# Patient Record
Sex: Male | Born: 1950 | Race: Black or African American | Hispanic: No | Marital: Married | Smoking: Never smoker
Health system: Southern US, Community
[De-identification: ages and names within clinical notes are randomized; demographics above are authoritative.]

## PROBLEM LIST (undated history)

## (undated) DIAGNOSIS — G4730 Sleep apnea, unspecified: Secondary | ICD-10-CM

## (undated) DIAGNOSIS — K219 Gastro-esophageal reflux disease without esophagitis: Secondary | ICD-10-CM

## (undated) DIAGNOSIS — E119 Type 2 diabetes mellitus without complications: Secondary | ICD-10-CM

## (undated) DIAGNOSIS — C61 Malignant neoplasm of prostate: Secondary | ICD-10-CM

## (undated) DIAGNOSIS — D649 Anemia, unspecified: Secondary | ICD-10-CM

## (undated) DIAGNOSIS — I10 Essential (primary) hypertension: Secondary | ICD-10-CM

## (undated) HISTORY — PX: LEG SURGERY: SHX1003

## (undated) HISTORY — PX: FOOT FRACTURE SURGERY: SHX645

## (undated) HISTORY — PX: PROSTATE BIOPSY: SHX241

---

## 1999-02-11 ENCOUNTER — Emergency Department (HOSPITAL_COMMUNITY): Admission: EM | Admit: 1999-02-11 | Discharge: 1999-02-11 | Payer: Self-pay | Admitting: Emergency Medicine

## 1999-02-11 ENCOUNTER — Encounter: Payer: Self-pay | Admitting: Emergency Medicine

## 1999-03-06 ENCOUNTER — Ambulatory Visit (HOSPITAL_BASED_OUTPATIENT_CLINIC_OR_DEPARTMENT_OTHER): Admission: RE | Admit: 1999-03-06 | Discharge: 1999-03-06 | Payer: Self-pay | Admitting: *Deleted

## 1999-05-18 ENCOUNTER — Ambulatory Visit (HOSPITAL_COMMUNITY): Admission: RE | Admit: 1999-05-18 | Discharge: 1999-05-18 | Payer: Self-pay | Admitting: *Deleted

## 2010-08-09 ENCOUNTER — Encounter: Payer: Self-pay | Admitting: Internal Medicine

## 2014-10-02 ENCOUNTER — Other Ambulatory Visit: Payer: Self-pay | Admitting: Gastroenterology

## 2014-10-03 ENCOUNTER — Encounter (HOSPITAL_COMMUNITY): Payer: Self-pay | Admitting: *Deleted

## 2014-10-07 ENCOUNTER — Ambulatory Visit (HOSPITAL_COMMUNITY): Payer: Medicare PPO | Admitting: Anesthesiology

## 2014-10-07 ENCOUNTER — Encounter (HOSPITAL_COMMUNITY): Payer: Self-pay | Admitting: Gastroenterology

## 2014-10-07 ENCOUNTER — Ambulatory Visit (HOSPITAL_COMMUNITY)
Admission: RE | Admit: 2014-10-07 | Discharge: 2014-10-07 | Disposition: A | Discharge status: Home / Self Care | Payer: Medicare PPO | Source: Ambulatory Visit | Attending: Gastroenterology | Admitting: Gastroenterology

## 2014-10-07 ENCOUNTER — Encounter (HOSPITAL_COMMUNITY)
Admission: RE | Disposition: A | Discharge status: Home / Self Care | Payer: Self-pay | Source: Ambulatory Visit | Attending: Gastroenterology

## 2014-10-07 DIAGNOSIS — K219 Gastro-esophageal reflux disease without esophagitis: Secondary | ICD-10-CM | POA: Diagnosis not present

## 2014-10-07 DIAGNOSIS — E119 Type 2 diabetes mellitus without complications: Secondary | ICD-10-CM | POA: Diagnosis not present

## 2014-10-07 DIAGNOSIS — I10 Essential (primary) hypertension: Secondary | ICD-10-CM | POA: Diagnosis not present

## 2014-10-07 DIAGNOSIS — Z79899 Other long term (current) drug therapy: Secondary | ICD-10-CM | POA: Diagnosis not present

## 2014-10-07 DIAGNOSIS — G4733 Obstructive sleep apnea (adult) (pediatric): Secondary | ICD-10-CM | POA: Insufficient documentation

## 2014-10-07 DIAGNOSIS — K648 Other hemorrhoids: Secondary | ICD-10-CM | POA: Insufficient documentation

## 2014-10-07 DIAGNOSIS — K5730 Diverticulosis of large intestine without perforation or abscess without bleeding: Secondary | ICD-10-CM | POA: Diagnosis not present

## 2014-10-07 DIAGNOSIS — E780 Pure hypercholesterolemia: Secondary | ICD-10-CM | POA: Insufficient documentation

## 2014-10-07 DIAGNOSIS — K921 Melena: Secondary | ICD-10-CM | POA: Diagnosis present

## 2014-10-07 HISTORY — DX: Type 2 diabetes mellitus without complications: E11.9

## 2014-10-07 HISTORY — PX: COLONOSCOPY WITH PROPOFOL: SHX5780

## 2014-10-07 HISTORY — DX: Essential (primary) hypertension: I10

## 2014-10-07 HISTORY — DX: Gastro-esophageal reflux disease without esophagitis: K21.9

## 2014-10-07 HISTORY — DX: Sleep apnea, unspecified: G47.30

## 2014-10-07 LAB — GLUCOSE, CAPILLARY: Glucose-Capillary: 136 mg/dL — ABNORMAL HIGH (ref 70–99)

## 2014-10-07 SURGERY — COLONOSCOPY WITH PROPOFOL
Anesthesia: Monitor Anesthesia Care

## 2014-10-07 MED ORDER — LACTATED RINGERS IV SOLN
INTRAVENOUS | Status: DC
  Administered 2014-10-07: 1000 mL via INTRAVENOUS

## 2014-10-07 MED ORDER — SODIUM CHLORIDE 0.9 % IV SOLN: INTRAVENOUS | Status: DC

## 2014-10-07 MED ORDER — PROPOFOL 10 MG/ML IV BOLUS
INTRAVENOUS | Status: AC
  Filled 2014-10-07: qty 20

## 2014-10-07 MED ORDER — PROPOFOL INFUSION 10 MG/ML OPTIME
INTRAVENOUS | Status: DC | PRN
  Administered 2014-10-07: 160 ug/kg/min via INTRAVENOUS

## 2014-10-07 SURGICAL SUPPLY — 22 items

## 2014-10-07 NOTE — H&P (Signed)
  Problem: Painless hematochezia on aspirin. Normal CBC. 08/26/2011 normal surveillance colonoscopy. 04/15/2005 colonoscopy with removal of a diminutive adenomatous cecal polyp. Colonic diverticulosis.  History: The patient is a 64 year old male born 1950-12-22. For approximately 2 weeks, the patient experienced intermittent small-volume painless hematochezia despite using Anusol cream on his external hemorrhoids. In 2006, he underwent a colonoscopy with removal of a diminutive cecal adenomatous polyp. In 2013, his surveillance colonoscopy was normal except for the presence of colonic diverticulosis.  The patient is scheduled to undergo diagnostic colonoscopy today.  Past medical history: Type 2 diabetes mellitus. Hypercholesterolemia. Obstructive sleep apnea syndrome. Hypertension. Gastroesophageal reflux. Left hip surgery. Sinus surgery.  Medication allergies: Sudafed caused palpitations. Hydrocodone caused itching. Metformin causes diarrhea.  Exam: The patient is alert and lying comfortably on the endoscopy stretcher. Abdomen is soft and nontender to palpation. Lungs are clear to auscultation. Cardiac exam reveals a regular rhythm.  Plan: Proceed with diagnostic colonoscopy to evaluate painless hematochezia.

## 2014-10-07 NOTE — Anesthesia Postprocedure Evaluation (Signed)
Anesthesia Post Note  Patient: Victor Garcia  Procedure(s) Performed: Procedure(s) (LRB): COLONOSCOPY WITH PROPOFOL (N/A)  Anesthesia type: general  Patient location: PACU  Post pain: Pain level controlled  Post assessment: Patient's Cardiovascular Status Stable  Last Vitals:  Filed Vitals:   10/07/14 0820  BP: 147/83  Pulse: 58  Temp:   Resp: 15    Post vital signs: Reviewed and stable  Level of consciousness: sedated  Complications: No apparent anesthesia complications

## 2014-10-07 NOTE — Transfer of Care (Signed)
Immediate Anesthesia Transfer of Care Note  Patient: Victor Garcia  Procedure(s) Performed: Procedure(s): COLONOSCOPY WITH PROPOFOL (N/A)  Patient Location: PACU  Anesthesia Type:MAC  Level of Consciousness: sedated  Airway & Oxygen Therapy: Patient Spontanous Breathing and Patient connected to nasal cannula oxygen  Post-op Assessment: Report given to RN and Post -op Vital signs reviewed and stable  Post vital signs: Reviewed and stable  Last Vitals:  Filed Vitals:   10/07/14 0654  BP: 156/83  Pulse: 65  Temp: 36.6 C  Resp: 22    Complications: No apparent anesthesia complications

## 2014-10-07 NOTE — Anesthesia Preprocedure Evaluation (Signed)
Anesthesia Evaluation  Patient identified by MRN, date of birth, ID band Patient awake    Reviewed: Allergy & Precautions, NPO status , Patient's Chart, lab work & pertinent test results  Airway Mallampati: II  TM Distance: >3 FB Neck ROM: Full    Dental   Pulmonary sleep apnea ,          Cardiovascular hypertension, Pt. on medications     Neuro/Psych    GI/Hepatic GERD-  Medicated and Controlled,  Endo/Other  diabetes, Type 2, Oral Hypoglycemic Agents  Renal/GU      Musculoskeletal   Abdominal   Peds  Hematology   Anesthesia Other Findings   Reproductive/Obstetrics                             Anesthesia Physical Anesthesia Plan  ASA: III  Anesthesia Plan: MAC   Post-op Pain Management:    Induction: Intravenous  Airway Management Planned: Natural Airway  Additional Equipment:   Intra-op Plan:   Post-operative Plan:   Informed Consent: I have reviewed the patients History and Physical, chart, labs and discussed the procedure including the risks, benefits and alternatives for the proposed anesthesia with the patient or authorized representative who has indicated his/her understanding and acceptance.     Plan Discussed with: CRNA and Surgeon  Anesthesia Plan Comments:         Anesthesia Quick Evaluation

## 2014-10-07 NOTE — Op Note (Signed)
Procedure: Diagnostic colonoscopy to evaluate painless hematochezia on aspirin. Normal CBC. 08/26/2011 normal surveillance colonoscopy. 04/15/2005 colonoscopy with removal of a diminutive adenomatous cecal polyp. Universal colonic diverticulosis.  Endoscopist: Earle Gell  Premedication: Propofol administered by anesthesia  Procedure: The patient was placed in the left lateral decubitus position. Anal inspection and digital rectal exam were normal. The Pentax pediatric colonoscope was introduced into the rectum and advanced to the cecum. A normal-appearing appendiceal orifice was identified. A normal-appearing ileocecal valve was identified. Colonic preparation for the exam today was satisfactory. Withdrawal time was 14 minutes.  Universal colonic diverticulosis was present without signs of gastrointestinal bleeding  Rectum. Normal. Retroflex view of the distal rectum showed moderate sized non-bleeding internal hemorrhoids  Sigmoid colon and descending colon. Normal  Splenic flexure. Normal  Transverse colon. Normal  Hepatic flexure. Normal  Ascending colon. Normal  Cecum and ileocecal valve. Normal  Assessment:  #1. Universal colonic diverticulosis without signs of gastrointestinal bleeding  #2. Moderate sized nonbleeding internal hemorrhoids  #3. No signs of colorectal neoplasia  Recommendation: Schedule repeat surveillance colonoscopy in 10 years

## 2014-10-08 ENCOUNTER — Encounter (HOSPITAL_COMMUNITY): Payer: Self-pay | Admitting: Gastroenterology

## 2014-12-04 DIAGNOSIS — H4011X1 Primary open-angle glaucoma, mild stage: Secondary | ICD-10-CM | POA: Diagnosis not present

## 2014-12-04 DIAGNOSIS — E119 Type 2 diabetes mellitus without complications: Secondary | ICD-10-CM | POA: Diagnosis not present

## 2014-12-19 DIAGNOSIS — H4011X1 Primary open-angle glaucoma, mild stage: Secondary | ICD-10-CM | POA: Diagnosis not present

## 2015-01-03 DIAGNOSIS — H4011X1 Primary open-angle glaucoma, mild stage: Secondary | ICD-10-CM | POA: Diagnosis not present

## 2015-01-03 DIAGNOSIS — H04123 Dry eye syndrome of bilateral lacrimal glands: Secondary | ICD-10-CM | POA: Diagnosis not present

## 2015-02-05 DIAGNOSIS — H4011X1 Primary open-angle glaucoma, mild stage: Secondary | ICD-10-CM | POA: Diagnosis not present

## 2015-02-19 ENCOUNTER — Ambulatory Visit
Admission: RE | Admit: 2015-02-19 | Discharge: 2015-02-19 | Disposition: A | Discharge status: Home / Self Care | Payer: Medicare PPO | Source: Ambulatory Visit | Attending: Internal Medicine | Admitting: Internal Medicine

## 2015-02-19 ENCOUNTER — Other Ambulatory Visit: Payer: Self-pay | Admitting: Internal Medicine

## 2015-02-19 DIAGNOSIS — M79671 Pain in right foot: Secondary | ICD-10-CM

## 2015-02-19 DIAGNOSIS — S99921A Unspecified injury of right foot, initial encounter: Secondary | ICD-10-CM | POA: Diagnosis not present

## 2015-02-24 DIAGNOSIS — M79671 Pain in right foot: Secondary | ICD-10-CM | POA: Diagnosis not present

## 2015-04-08 DIAGNOSIS — Z23 Encounter for immunization: Secondary | ICD-10-CM | POA: Diagnosis not present

## 2015-04-08 DIAGNOSIS — N5201 Erectile dysfunction due to arterial insufficiency: Secondary | ICD-10-CM | POA: Diagnosis not present

## 2015-04-08 DIAGNOSIS — I10 Essential (primary) hypertension: Secondary | ICD-10-CM | POA: Diagnosis not present

## 2015-04-08 DIAGNOSIS — G4733 Obstructive sleep apnea (adult) (pediatric): Secondary | ICD-10-CM | POA: Diagnosis not present

## 2015-04-08 DIAGNOSIS — E119 Type 2 diabetes mellitus without complications: Secondary | ICD-10-CM | POA: Diagnosis not present

## 2015-06-19 DIAGNOSIS — H04123 Dry eye syndrome of bilateral lacrimal glands: Secondary | ICD-10-CM | POA: Diagnosis not present

## 2015-06-19 DIAGNOSIS — H401131 Primary open-angle glaucoma, bilateral, mild stage: Secondary | ICD-10-CM | POA: Diagnosis not present

## 2017-03-05 ENCOUNTER — Encounter (HOSPITAL_COMMUNITY): Payer: Self-pay | Admitting: Emergency Medicine

## 2017-03-05 ENCOUNTER — Ambulatory Visit (HOSPITAL_COMMUNITY)
Admission: EM | Admit: 2017-03-05 | Discharge: 2017-03-05 | Disposition: A | Discharge status: Home / Self Care | Payer: Medicare Other | Attending: Emergency Medicine | Admitting: Emergency Medicine

## 2017-03-05 ENCOUNTER — Ambulatory Visit (INDEPENDENT_AMBULATORY_CARE_PROVIDER_SITE_OTHER): Payer: Self-pay

## 2017-03-05 DIAGNOSIS — S8002XA Contusion of left knee, initial encounter: Secondary | ICD-10-CM | POA: Diagnosis not present

## 2017-03-05 DIAGNOSIS — S39012A Strain of muscle, fascia and tendon of lower back, initial encounter: Secondary | ICD-10-CM
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
## 2019-08-27 ENCOUNTER — Ambulatory Visit: Payer: Self-pay | Attending: Internal Medicine

## 2019-08-27 DIAGNOSIS — Z23 Encounter for immunization: Secondary | ICD-10-CM | POA: Insufficient documentation

## 2019-08-27 NOTE — Progress Notes (Signed)
   Covid-19 Vaccination Clinic  Name:  Victor Garcia    MRN: [id number] DOB: 23-Jan-1951  08/27/2019  [name] was observed post Covid-19 immunization for 15 minutes without incidence. He was provided with Vaccine Information Sheet and instruction to access the V-Safe system.   [name] was instructed to call 911 with any severe reactions post vaccine: Marland Kitchen Difficulty breathing  . Swelling of your face and throat  . A fast heartbeat  . A bad rash all over your body  . Dizziness and weakness    Immunizations Administered    Name Date Dose VIS Date Route   Pfizer COVID-19 Vaccine 08/27/2019  2:38 PM 0.3 mL 06/29/2019 Intramuscular   Manufacturer: [place]   Lot: [id number]   [place]: [id number]

## 2019-09-21 ENCOUNTER — Ambulatory Visit: Payer: Medicare PPO | Attending: Internal Medicine

## 2019-09-21 DIAGNOSIS — Z23 Encounter for immunization: Secondary | ICD-10-CM

## 2019-09-21 NOTE — Progress Notes (Signed)
   Covid-19 Vaccination Clinic  Name:  Tyrion Tornatore    MRN: [id number] DOB: 1951/02/21  09/21/2019  Mr. Kofoed was observed post Covid-19 immunization for 15 minutes without incident. He was provided with Vaccine Information Sheet and instruction to access the V-Safe system.   Mr. Severson was instructed to call 911 with any severe reactions post vaccine: Marland Kitchen Difficulty breathing  . Swelling of face and throat  . A fast heartbeat  . A bad rash all over body  . Dizziness and weakness   Immunizations Administered    Name Date Dose VIS Date Route   Pfizer COVID-19 Vaccine 09/21/2019  9:53 AM 0.3 mL 06/29/2019 Intramuscular   Manufacturer: [place]   Lot: [id number]   [place]: [id number]

## 2019-10-26 DIAGNOSIS — H2000 Unspecified acute and subacute iridocyclitis: Secondary | ICD-10-CM | POA: Diagnosis not present

## 2019-10-26 DIAGNOSIS — H401131 Primary open-angle glaucoma, bilateral, mild stage: Secondary | ICD-10-CM | POA: Diagnosis not present

## 2019-11-09 DIAGNOSIS — H2000 Unspecified acute and subacute iridocyclitis: Secondary | ICD-10-CM | POA: Diagnosis not present

## 2019-11-09 DIAGNOSIS — H401131 Primary open-angle glaucoma, bilateral, mild stage: Secondary | ICD-10-CM | POA: Diagnosis not present

## 2019-11-13 DIAGNOSIS — H401131 Primary open-angle glaucoma, bilateral, mild stage: Secondary | ICD-10-CM | POA: Diagnosis not present

## 2019-11-13 DIAGNOSIS — E119 Type 2 diabetes mellitus without complications: Secondary | ICD-10-CM | POA: Diagnosis not present

## 2019-11-13 DIAGNOSIS — H524 Presbyopia: Secondary | ICD-10-CM | POA: Diagnosis not present

## 2019-11-13 DIAGNOSIS — H2513 Age-related nuclear cataract, bilateral: Secondary | ICD-10-CM | POA: Diagnosis not present

## 2019-12-12 DIAGNOSIS — R972 Elevated prostate specific antigen [PSA]: Secondary | ICD-10-CM | POA: Diagnosis not present

## 2019-12-22 DIAGNOSIS — B356 Tinea cruris: Secondary | ICD-10-CM | POA: Diagnosis not present

## 2020-01-09 DIAGNOSIS — H60502 Unspecified acute noninfective otitis externa, left ear: Secondary | ICD-10-CM | POA: Diagnosis not present

## 2020-02-15 DIAGNOSIS — H401131 Primary open-angle glaucoma, bilateral, mild stage: Secondary | ICD-10-CM | POA: Diagnosis not present

## 2020-02-26 DIAGNOSIS — G4733 Obstructive sleep apnea (adult) (pediatric): Secondary | ICD-10-CM | POA: Diagnosis not present

## 2020-02-26 DIAGNOSIS — I10 Essential (primary) hypertension: Secondary | ICD-10-CM | POA: Diagnosis not present

## 2020-02-26 DIAGNOSIS — E1169 Type 2 diabetes mellitus with other specified complication: Secondary | ICD-10-CM | POA: Diagnosis not present

## 2020-02-26 DIAGNOSIS — R972 Elevated prostate specific antigen [PSA]: Secondary | ICD-10-CM | POA: Diagnosis not present

## 2020-03-04 DIAGNOSIS — R972 Elevated prostate specific antigen [PSA]: Secondary | ICD-10-CM | POA: Diagnosis not present

## 2020-04-10 DIAGNOSIS — Z20828 Contact with and (suspected) exposure to other viral communicable diseases: Secondary | ICD-10-CM | POA: Diagnosis not present

## 2020-05-27 DIAGNOSIS — H401131 Primary open-angle glaucoma, bilateral, mild stage: Secondary | ICD-10-CM | POA: Diagnosis not present

## 2020-06-03 DIAGNOSIS — R972 Elevated prostate specific antigen [PSA]: Secondary | ICD-10-CM | POA: Diagnosis not present

## 2020-06-06 DIAGNOSIS — R972 Elevated prostate specific antigen [PSA]: Secondary | ICD-10-CM | POA: Diagnosis not present

## 2020-06-06 DIAGNOSIS — N5201 Erectile dysfunction due to arterial insufficiency: Secondary | ICD-10-CM | POA: Diagnosis not present

## 2020-06-25 DIAGNOSIS — Z23 Encounter for immunization: Secondary | ICD-10-CM | POA: Diagnosis not present

## 2020-07-08 DIAGNOSIS — H401131 Primary open-angle glaucoma, bilateral, mild stage: Secondary | ICD-10-CM | POA: Diagnosis not present

## 2020-07-22 DIAGNOSIS — C61 Malignant neoplasm of prostate: Secondary | ICD-10-CM | POA: Diagnosis not present

## 2020-07-22 DIAGNOSIS — R972 Elevated prostate specific antigen [PSA]: Secondary | ICD-10-CM | POA: Diagnosis not present

## 2020-07-22 DIAGNOSIS — N411 Chronic prostatitis: Secondary | ICD-10-CM | POA: Diagnosis not present

## 2020-07-29 DIAGNOSIS — N5201 Erectile dysfunction due to arterial insufficiency: Secondary | ICD-10-CM | POA: Diagnosis not present

## 2020-07-29 DIAGNOSIS — C61 Malignant neoplasm of prostate: Secondary | ICD-10-CM | POA: Diagnosis not present

## 2020-08-06 ENCOUNTER — Other Ambulatory Visit: Payer: Self-pay | Admitting: Urology

## 2020-08-06 DIAGNOSIS — C61 Malignant neoplasm of prostate: Secondary | ICD-10-CM

## 2020-08-21 DIAGNOSIS — H43812 Vitreous degeneration, left eye: Secondary | ICD-10-CM | POA: Diagnosis not present

## 2020-08-25 ENCOUNTER — Encounter: Payer: Self-pay | Admitting: Radiation Oncology

## 2020-08-25 NOTE — Progress Notes (Signed)
GU Location of Tumor / Histology: prostatic adenocarcinoma  If Prostate Cancer, Gleason Score is (3 + 4) and PSA is (6.48). Prostate volume: 20.5 g  Isayah Ignasiak explains that his PCP, Dr. Laurann [place], checked his PSA right before the summer of 2021 and discovered it was elevated.  Biopsies of prostate (if applicable) revealed:    Past/Anticipated interventions by urology, if any: prostate biopsy, MRI ordered (09/06/20), referral to Dr. Tammi Klippel  Past/Anticipated interventions by medical oncology, if any: no  Weight changes, if any: 20 lb over 2 years intentionally.  Bowel/Bladder complaints, if any: IPSS 10. SHIM 16. Denies dysuria or hematuria. Denies urinary leakage or incontinence. Reports seeing blood in semen since biopsy.Taking sildenafil to manage ED. Denies bowel complaints.  Nausea/Vomiting, if any: denies  Pain issues, if any:  denies  SAFETY ISSUES:  Prior radiation? denies  Pacemaker/ICD? denies  Possible current pregnancy? no, male patient  Is the patient on methotrexate? denies  Current Complaints / other details:  70 year old male. Married. Resides in [place]. Retired Advertising copywriter. 3 daughter. Father with prostate cancer.

## 2020-08-26 ENCOUNTER — Other Ambulatory Visit: Payer: Self-pay

## 2020-08-26 ENCOUNTER — Encounter: Payer: Self-pay | Admitting: Radiation Oncology

## 2020-08-26 ENCOUNTER — Ambulatory Visit
Admission: RE | Admit: 2020-08-26 | Discharge: 2020-08-26 | Disposition: A | Discharge status: Home / Self Care | Payer: Medicare PPO | Source: Ambulatory Visit | Attending: Radiation Oncology | Admitting: Radiation Oncology

## 2020-08-26 VITALS — BP 149/79 | HR 96 | Temp 97.5°F | Resp 18 | Ht 70.0 in | Wt 180.4 lb

## 2020-08-26 DIAGNOSIS — K219 Gastro-esophageal reflux disease without esophagitis: Secondary | ICD-10-CM | POA: Diagnosis not present

## 2020-08-26 DIAGNOSIS — Z79899 Other long term (current) drug therapy: Secondary | ICD-10-CM | POA: Insufficient documentation

## 2020-08-26 DIAGNOSIS — I10 Essential (primary) hypertension: Secondary | ICD-10-CM | POA: Diagnosis not present

## 2020-08-26 DIAGNOSIS — G4733 Obstructive sleep apnea (adult) (pediatric): Secondary | ICD-10-CM | POA: Insufficient documentation

## 2020-08-26 DIAGNOSIS — E669 Obesity, unspecified: Secondary | ICD-10-CM | POA: Insufficient documentation

## 2020-08-26 DIAGNOSIS — E782 Mixed hyperlipidemia: Secondary | ICD-10-CM | POA: Insufficient documentation

## 2020-08-26 DIAGNOSIS — R208 Other disturbances of skin sensation: Secondary | ICD-10-CM | POA: Insufficient documentation

## 2020-08-26 DIAGNOSIS — G4730 Sleep apnea, unspecified: Secondary | ICD-10-CM | POA: Diagnosis not present

## 2020-08-26 DIAGNOSIS — C61 Malignant neoplasm of prostate: Secondary | ICD-10-CM | POA: Insufficient documentation

## 2020-08-26 DIAGNOSIS — R972 Elevated prostate specific antigen [PSA]: Secondary | ICD-10-CM | POA: Diagnosis not present

## 2020-08-26 DIAGNOSIS — Z7984 Long term (current) use of oral hypoglycemic drugs: Secondary | ICD-10-CM | POA: Diagnosis not present

## 2020-08-26 DIAGNOSIS — E1169 Type 2 diabetes mellitus with other specified complication: Secondary | ICD-10-CM | POA: Insufficient documentation

## 2020-08-26 DIAGNOSIS — E119 Type 2 diabetes mellitus without complications: Secondary | ICD-10-CM | POA: Diagnosis not present

## 2020-08-26 DIAGNOSIS — M5030 Other cervical disc degeneration, unspecified cervical region: Secondary | ICD-10-CM | POA: Insufficient documentation

## 2020-08-26 DIAGNOSIS — N5201 Erectile dysfunction due to arterial insufficiency: Secondary | ICD-10-CM | POA: Insufficient documentation

## 2020-08-26 DIAGNOSIS — D126 Benign neoplasm of colon, unspecified: Secondary | ICD-10-CM | POA: Insufficient documentation

## 2020-08-26 HISTORY — DX: Malignant neoplasm of prostate: C61

## 2020-08-26 NOTE — Progress Notes (Signed)
Radiation Oncology         [phone] ________________________________  Initial outpatient Consultation  Name: Victor Garcia MRN: [id number]  Date: 08/26/2020  DOB: 02/23/1951  GE:[id number], Jenny Reichmann, MD  Ardis Hughs, MD   REFERRING PHYSICIAN: Ardis Hughs, MD  DIAGNOSIS: 70 y.o. gentleman with Stage T2a adenocarcinoma of the prostate with Gleason score of 3+4, and PSA of 6.48.    ICD-10-CM   1. Malignant neoplasm of prostate ([place])  C61     HISTORY OF PRESENT ILLNESS: Victor Garcia is a 70 y.o. male with a diagnosis of prostate cancer. He was noted to have an elevated PSA at 4.7 by his primary care physician, Dr. Laurann Montana.  Accordingly, he was referred for evaluation in urology by Dr. Louis Meckel on 03/04/20,  digital rectal examination was performed at that time revealing asymmetry with a firm right lobe. His PSA continued to rise, up to 5.95 in 02/2020 and 6.48 in 05/2020. The patient proceeded to transrectal ultrasound with 12 biopsies of the prostate on 07/22/2020.  The prostate volume measured 20.5 cc.  Out of 14 core biopsies, 5 were positive.  The maximum Gleason score was 3+4, and this was seen in the left base lateral, right mid, both right mid lateral cores, and right apex lateral.  At the time of prostate ultrasound, there was evidence of possible seminal vesicle invasion on the right with a soft tissue mass noted at the right lateral base and appeared to be extending into the seminal vesicles. He is scheduled for prostate MRI on 09/06/2020 for further evaluation of this area.  The patient reviewed the biopsy results with his urologist and he has kindly been referred today for discussion of potential radiation treatment options.    PREVIOUS RADIATION THERAPY: No  PAST MEDICAL HISTORY:  Past Medical History:  Diagnosis Date  . Diabetes mellitus without complication ([place])   . GERD (gastroesophageal reflux disease)   . Hypertension   . Prostate cancer ([place])   . Sleep apnea     cpap      PAST SURGICAL HISTORY: Past Surgical History:  Procedure Laterality Date  . COLONOSCOPY WITH PROPOFOL N/A 10/07/2014   Procedure: COLONOSCOPY WITH PROPOFOL;  Surgeon: Garlan Fair, MD;  Location: [facility];  Service: Endoscopy;  Laterality: N/A;  . FOOT FRACTURE SURGERY Right    ORIF  . LEG SURGERY Left    Left leg surgery for" fracture near hip bone"  . PROSTATE BIOPSY      FAMILY HISTORY:  Family History  Problem Relation Age of Onset  . Prostate cancer Father   . Breast cancer Sister   . Colon cancer Neg Hx   . Pancreatic cancer Neg Hx     SOCIAL HISTORY:  Social History   Socioeconomic History  . Marital status: Married    Spouse name: Not on file  . Number of children: 3  . Years of education: Not on file  . Highest education level: Not on file  Occupational History  . Not on file  Tobacco Use  . Smoking status: Never Smoker  . Smokeless tobacco: Never Used  Vaping Use  . Vaping Use: Never used  Substance and Sexual Activity  . Alcohol use: Yes    Comment: daily use  . Drug use: No  . Sexual activity: Yes    Comment: using sildenafil  Other Topics Concern  . Not on file  Social History Narrative  . Not on file   Social Determinants of Health  Financial Resource Strain: Not on file  Food Insecurity: Not on file  Transportation Needs: Not on file  Physical Activity: Not on file  Stress: Not on file  Social Connections: Not on file  Intimate Partner Violence: Not on file    ALLERGIES: Hydrocodone, Hydrocodone-acetaminophen, Metformin, and Other  MEDICATIONS:  Current Outpatient Medications  Medication Sig Dispense Refill  . carvedilol (COREG) 12.5 MG tablet Take 12.5 mg by mouth daily.   4  . gabapentin (NEURONTIN) 300 MG capsule 3 capsules    . hydrocortisone 2.5 % cream 1 application    . hydrOXYzine (ATARAX/VISTARIL) 50 MG tablet Take 1 tablet by mouth at bedtime.    Marland Kitchen LOSARTAN POTASSIUM PO Take 1 tablet by mouth daily.     . pioglitazone (ACTOS) 30 MG tablet Take 1 tablet by mouth daily.    Marland Kitchen PRAVASTATIN SODIUM PO Take by mouth.    . terbinafine (LAMISIL) 1 % cream 1 application    . triamcinolone (KENALOG) 0.1 % 1 application     No current facility-administered medications for this encounter.    REVIEW OF SYSTEMS:  On review of systems, the patient reports that he is doing well overall. He denies any chest pain, shortness of breath, cough, fevers, chills, night sweats, unintended weight changes. He denies any bowel disturbances, and denies abdominal pain, nausea or vomiting. He denies any new musculoskeletal or joint aches or pains. His IPSS was 10, indicating moderate urinary symptoms. He reports blood in his semen since biopsy. His SHIM was 16, indicating he has moderate erectile dysfunction. He endorses managing ED with sildenafil. A complete review of systems is obtained and is otherwise negative.    PHYSICAL EXAM:  Wt Readings from Last 3 Encounters:  08/26/20 180 lb 6 oz (81.8 kg)   Temp Readings from Last 3 Encounters:  08/26/20 (!) 97.5 F (36.4 C) (Temporal)  03/05/17 98.6 F (37 C) (Oral)  10/07/14 97.7 F (36.5 C) (Oral)   BP Readings from Last 3 Encounters:  08/26/20 (!) 149/79  03/05/17 136/65  10/07/14 (!) 147/83   Pulse Readings from Last 3 Encounters:  08/26/20 96  03/05/17 75  10/07/14 (!) 58   Pain Assessment Pain Score: 0-No pain/10  In general this is a well appearing African-American male in no acute distress. He's alert and oriented x4 and appropriate throughout the examination. Cardiopulmonary assessment is negative for acute distress, and he exhibits normal effort.     KPS = 100  100 - Normal; no complaints; no evidence of disease. 90   - Able to carry on normal activity; minor signs or symptoms of disease. 80   - Normal activity with effort; some signs or symptoms of disease. 33   - Cares for self; unable to carry on normal activity or to do active work. 60   -  Requires occasional assistance, but is able to care for most of his personal needs. 50   - Requires considerable assistance and frequent medical care. 55   - Disabled; requires special care and assistance. 59   - Severely disabled; hospital admission is indicated although death not imminent. 86   - Very sick; hospital admission necessary; active supportive treatment necessary. 10   - Moribund; fatal processes progressing rapidly. 0     - Dead  Karnofsky DA, Abelmann WH, Craver LS and Burchenal [facility] [phone]) The use of the nitrogen mustards in the palliative treatment of carcinoma: with particular reference to bronchogenic carcinoma Cancer 1 [phone]  LABORATORY DATA:  No results found for: WBC, HGB, HCT, MCV, PLT No results found for: NA, K, CL, CO2 No results found for: ALT, AST, GGT, ALKPHOS, BILITOT   RADIOGRAPHY: No results found.    IMPRESSION/PLAN: 1. 70 y.o. gentleman with Stage T2a adenocarcinoma of the prostate with Gleason Score of 3+4, and PSA of 6.48. We discussed the patient's workup and outlined the nature of prostate cancer in this setting. The patient's T stage, Gleason's score, and PSA put him into the favorable intermediate risk group.  There is some concern for possible seminal vesicle involvement on the right based on a soft tissue mass that was noted at the right lateral base and appeared to be extending into the seminal vesicles at the time of transrectal ultrasound prostate biopsy.  He is scheduled for prostate MRI for further evaluation and pending there are no unanticipated findings to suggest metastatic disease on this exam, he is eligible for a variety of potential treatment options including brachytherapy, 5.5 weeks of external radiation, or prostatectomy.  If there is evidence of seminal vesicle involvement on his upcoming MRI, brachytherapy would not be favored.  We discussed the available radiation techniques, and focused on the details and logistics of delivery. We  discussed and outlined the risks, benefits, short and long-term effects associated with radiotherapy and compared and contrasted these with prostatectomy. We discussed the role of SpaceOAR gel in reducing the rectal toxicity associated with radiotherapy.  He was encouraged to ask questions that were answered to his stated satisfaction.  At the conclusion of our conversation, the patient is leaning towards moving forward with 5.5 weeks of external beam therapy. He has a 43 year old grandson that lives with him, so he is not interested in brachytherapy even if this is proven to be a viable option. He will proceed with prostate MRI as scheduled and pending no unexpected findings to suggest metastatic disease, we will proceed with treatment planning for 5.5 weeks of XRT. We will share our discussion with Dr. Louis Meckel and await the prostate MRI results.  If appropriate, we will make arrangements for fiducial markers and SpaceOAR gel placement, prior to simulation, to reduce rectal toxicity from radiotherapy. The patient appears to have a good understanding of his disease and our treatment recommendations which are of curative intent and is in agreement with the stated plan.  Therefore, we will move forward with treatment planning accordingly, in anticipation of beginning IMRT in the near future.    Nicholos Johns, PA-C    Tyler Pita, MD  [facility] Direct Dial: [phone]  Fax: [phone] [email or web address]  Skype  [email or web address]   This document serves as a record of services personally performed by Tyler Pita, MD and Freeman Caldron, PA-C. It was created on their behalf by Wilburn Mylar, a trained medical scribe. The creation of this record is based on the scribe's personal observations and the provider's statements to them. This document has been checked and approved by the attending provider.

## 2020-09-05 DIAGNOSIS — C61 Malignant neoplasm of prostate: Secondary | ICD-10-CM | POA: Diagnosis not present

## 2020-09-05 DIAGNOSIS — Z1389 Encounter for screening for other disorder: Secondary | ICD-10-CM | POA: Diagnosis not present

## 2020-09-05 DIAGNOSIS — E782 Mixed hyperlipidemia: Secondary | ICD-10-CM | POA: Diagnosis not present

## 2020-09-05 DIAGNOSIS — Z0000 Encounter for general adult medical examination without abnormal findings: Secondary | ICD-10-CM | POA: Diagnosis not present

## 2020-09-05 DIAGNOSIS — I10 Essential (primary) hypertension: Secondary | ICD-10-CM | POA: Diagnosis not present

## 2020-09-05 DIAGNOSIS — R208 Other disturbances of skin sensation: Secondary | ICD-10-CM | POA: Diagnosis not present

## 2020-09-05 DIAGNOSIS — E1169 Type 2 diabetes mellitus with other specified complication: Secondary | ICD-10-CM | POA: Diagnosis not present

## 2020-09-05 DIAGNOSIS — G4733 Obstructive sleep apnea (adult) (pediatric): Secondary | ICD-10-CM | POA: Diagnosis not present

## 2020-09-05 DIAGNOSIS — Z23 Encounter for immunization: Secondary | ICD-10-CM | POA: Diagnosis not present

## 2020-09-06 ENCOUNTER — Other Ambulatory Visit: Payer: Self-pay

## 2020-09-06 ENCOUNTER — Ambulatory Visit
Admission: RE | Admit: 2020-09-06 | Discharge: 2020-09-06 | Disposition: A | Discharge status: Home / Self Care | Payer: Medicare PPO | Source: Ambulatory Visit | Attending: Urology | Admitting: Urology

## 2020-09-06 DIAGNOSIS — C61 Malignant neoplasm of prostate: Secondary | ICD-10-CM

## 2020-09-06 DIAGNOSIS — K4090 Unilateral inguinal hernia, without obstruction or gangrene, not specified as recurrent: Secondary | ICD-10-CM | POA: Diagnosis not present

## 2020-09-06 DIAGNOSIS — R590 Localized enlarged lymph nodes: Secondary | ICD-10-CM | POA: Diagnosis not present

## 2020-09-06 MED ORDER — GADOBENATE DIMEGLUMINE 529 MG/ML IV SOLN
18.0000 mL | Freq: Once | INTRAVENOUS | Status: AC | PRN
  Administered 2020-09-06: 18 mL via INTRAVENOUS

## 2020-09-17 ENCOUNTER — Encounter: Payer: Self-pay | Admitting: Urology

## 2020-09-17 NOTE — Progress Notes (Signed)
This patient will have his fid. Markers and space oar placed on 09-23-20 @ [facility] and his sim will be on 09-26-20 @ Dr. Johny Shears Office.   Enid Derry

## 2020-09-23 DIAGNOSIS — C61 Malignant neoplasm of prostate: Secondary | ICD-10-CM | POA: Diagnosis not present

## 2020-09-25 ENCOUNTER — Telehealth: Payer: Self-pay | Admitting: *Deleted

## 2020-09-25 NOTE — Telephone Encounter (Signed)
Called patient to remind of sim for 09-26-20- arrival time - 2:15 pm @ [place], lvm for a return call

## 2020-09-26 ENCOUNTER — Ambulatory Visit
Admission: RE | Admit: 2020-09-26 | Discharge: 2020-09-26 | Disposition: A | Discharge status: Home / Self Care | Payer: Medicare PPO | Source: Ambulatory Visit | Attending: Radiation Oncology | Admitting: Radiation Oncology

## 2020-09-26 ENCOUNTER — Other Ambulatory Visit: Payer: Self-pay

## 2020-09-26 DIAGNOSIS — C61 Malignant neoplasm of prostate: Secondary | ICD-10-CM | POA: Diagnosis not present

## 2020-09-26 DIAGNOSIS — Z510 Encounter for antineoplastic radiation therapy: Secondary | ICD-10-CM | POA: Diagnosis not present

## 2020-09-26 NOTE — Progress Notes (Signed)
  Radiation Oncology         [phone] ________________________________  Name: Victor Garcia MRN: [id number]  Date: 09/26/2020  DOB: [birth year]  SIMULATION AND TREATMENT PLANNING NOTE    ICD-10-CM   1. Malignant neoplasm of prostate ([place])  C61     DIAGNOSIS:  70 y.o. gentleman with Stage T2a adenocarcinoma of the prostate with Gleason Score of 3+4, and PSA of 6.48.  NARRATIVE:  The patient was brought to the [facility].  Identity was confirmed.  All relevant records and images related to the planned course of therapy were reviewed.  The patient freely provided informed written consent to proceed with treatment after reviewing the details related to the planned course of therapy. The consent form was witnessed and verified by the simulation staff.  Then, the patient was set-up in a stable reproducible supine position for radiation therapy.  A vacuum lock pillow device was custom fabricated to position his legs in a reproducible immobilized position.  Then, I performed a urethrogram under sterile conditions to identify the prostatic apex.  CT images were obtained.  Surface markings were placed.  The CT images were loaded into the planning software.  Then the prostate target and avoidance structures including the rectum, bladder, bowel and hips were contoured.  Treatment planning then occurred.  The radiation prescription was entered and confirmed.  A total of one complex treatment devices was fabricated. I have requested : Intensity Modulated Radiotherapy (IMRT) is medically necessary for this case for the following reason:  Rectal sparing.Marland Kitchen  PLAN:  The patient will receive 70 Gy in 28 fractions.  ________________________________  Sheral Apley Tammi Klippel, M.D.  This document serves as a record of services personally performed by Tyler Pita, MD. It was created on his behalf by Wilburn Mylar, a trained medical scribe. The creation of this record is based on the scribe's  personal observations and the provider's statements to them. This document has been checked and approved by the attending provider.

## 2020-09-29 DIAGNOSIS — C61 Malignant neoplasm of prostate: Secondary | ICD-10-CM | POA: Diagnosis not present

## 2020-09-29 DIAGNOSIS — Z510 Encounter for antineoplastic radiation therapy: Secondary | ICD-10-CM | POA: Diagnosis not present

## 2020-10-06 DIAGNOSIS — H401131 Primary open-angle glaucoma, bilateral, mild stage: Secondary | ICD-10-CM | POA: Diagnosis not present

## 2020-10-06 DIAGNOSIS — H43812 Vitreous degeneration, left eye: Secondary | ICD-10-CM | POA: Diagnosis not present

## 2020-10-07 ENCOUNTER — Ambulatory Visit
Admission: RE | Admit: 2020-10-07 | Discharge: 2020-10-07 | Disposition: A | Discharge status: Home / Self Care | Payer: Medicare PPO | Source: Ambulatory Visit | Attending: Radiation Oncology | Admitting: Radiation Oncology

## 2020-10-07 ENCOUNTER — Other Ambulatory Visit: Payer: Self-pay

## 2020-10-07 DIAGNOSIS — Z510 Encounter for antineoplastic radiation therapy: Secondary | ICD-10-CM | POA: Diagnosis not present

## 2020-10-07 DIAGNOSIS — C61 Malignant neoplasm of prostate: Secondary | ICD-10-CM | POA: Diagnosis not present

## 2020-10-08 ENCOUNTER — Other Ambulatory Visit: Payer: Self-pay

## 2020-10-08 ENCOUNTER — Ambulatory Visit
Admission: RE | Admit: 2020-10-08 | Discharge: 2020-10-08 | Disposition: A | Discharge status: Home / Self Care | Payer: Medicare PPO | Source: Ambulatory Visit | Attending: Radiation Oncology | Admitting: Radiation Oncology

## 2020-10-08 DIAGNOSIS — C61 Malignant neoplasm of prostate: Secondary | ICD-10-CM | POA: Diagnosis not present

## 2020-10-08 DIAGNOSIS — Z510 Encounter for antineoplastic radiation therapy: Secondary | ICD-10-CM | POA: Diagnosis not present

## 2020-10-09 ENCOUNTER — Ambulatory Visit
Admission: RE | Admit: 2020-10-09 | Discharge: 2020-10-09 | Disposition: A | Discharge status: Home / Self Care | Payer: Medicare PPO | Source: Ambulatory Visit | Attending: Radiation Oncology | Admitting: Radiation Oncology

## 2020-10-09 DIAGNOSIS — C61 Malignant neoplasm of prostate: Secondary | ICD-10-CM | POA: Diagnosis not present

## 2020-10-09 DIAGNOSIS — Z510 Encounter for antineoplastic radiation therapy: Secondary | ICD-10-CM | POA: Diagnosis not present

## 2020-10-10 ENCOUNTER — Other Ambulatory Visit: Payer: Self-pay

## 2020-10-10 ENCOUNTER — Ambulatory Visit
Admission: RE | Admit: 2020-10-10 | Discharge: 2020-10-10 | Disposition: A | Discharge status: Home / Self Care | Payer: Medicare PPO | Source: Ambulatory Visit | Attending: Radiation Oncology | Admitting: Radiation Oncology

## 2020-10-10 DIAGNOSIS — Z510 Encounter for antineoplastic radiation therapy: Secondary | ICD-10-CM | POA: Diagnosis not present

## 2020-10-10 DIAGNOSIS — C61 Malignant neoplasm of prostate: Secondary | ICD-10-CM | POA: Diagnosis not present

## 2020-10-13 ENCOUNTER — Other Ambulatory Visit: Payer: Self-pay

## 2020-10-13 ENCOUNTER — Ambulatory Visit
Admission: RE | Admit: 2020-10-13 | Discharge: 2020-10-13 | Disposition: A | Discharge status: Home / Self Care | Payer: Medicare PPO | Source: Ambulatory Visit | Attending: Radiation Oncology | Admitting: Radiation Oncology

## 2020-10-13 DIAGNOSIS — Z510 Encounter for antineoplastic radiation therapy: Secondary | ICD-10-CM | POA: Diagnosis not present

## 2020-10-13 DIAGNOSIS — C61 Malignant neoplasm of prostate: Secondary | ICD-10-CM | POA: Diagnosis not present

## 2020-10-14 ENCOUNTER — Ambulatory Visit
Admission: RE | Admit: 2020-10-14 | Discharge: 2020-10-14 | Disposition: A | Discharge status: Home / Self Care | Payer: Medicare PPO | Source: Ambulatory Visit | Attending: Radiation Oncology | Admitting: Radiation Oncology

## 2020-10-14 DIAGNOSIS — Z510 Encounter for antineoplastic radiation therapy: Secondary | ICD-10-CM | POA: Diagnosis not present

## 2020-10-14 DIAGNOSIS — C61 Malignant neoplasm of prostate: Secondary | ICD-10-CM | POA: Diagnosis not present

## 2020-10-15 ENCOUNTER — Ambulatory Visit: Payer: Medicare PPO

## 2020-10-16 ENCOUNTER — Other Ambulatory Visit: Payer: Self-pay

## 2020-10-16 ENCOUNTER — Ambulatory Visit
Admission: RE | Admit: 2020-10-16 | Discharge: 2020-10-16 | Disposition: A | Discharge status: Home / Self Care | Payer: Medicare PPO | Source: Ambulatory Visit | Attending: Radiation Oncology | Admitting: Radiation Oncology

## 2020-10-16 DIAGNOSIS — C61 Malignant neoplasm of prostate: Secondary | ICD-10-CM | POA: Diagnosis not present

## 2020-10-16 DIAGNOSIS — Z510 Encounter for antineoplastic radiation therapy: Secondary | ICD-10-CM | POA: Diagnosis not present

## 2020-10-17 ENCOUNTER — Ambulatory Visit
Admission: RE | Admit: 2020-10-17 | Discharge: 2020-10-17 | Disposition: A | Discharge status: Home / Self Care | Payer: Medicare PPO | Source: Ambulatory Visit | Attending: Radiation Oncology | Admitting: Radiation Oncology

## 2020-10-17 ENCOUNTER — Other Ambulatory Visit: Payer: Self-pay

## 2020-10-17 ENCOUNTER — Encounter: Payer: Self-pay | Admitting: Radiation Oncology

## 2020-10-17 DIAGNOSIS — Z510 Encounter for antineoplastic radiation therapy: Secondary | ICD-10-CM | POA: Diagnosis not present

## 2020-10-17 DIAGNOSIS — C61 Malignant neoplasm of prostate: Secondary | ICD-10-CM | POA: Diagnosis not present

## 2020-10-20 ENCOUNTER — Other Ambulatory Visit: Payer: Self-pay

## 2020-10-20 ENCOUNTER — Ambulatory Visit
Admission: RE | Admit: 2020-10-20 | Discharge: 2020-10-20 | Disposition: A | Discharge status: Home / Self Care | Payer: Medicare PPO | Source: Ambulatory Visit | Attending: Radiation Oncology | Admitting: Radiation Oncology

## 2020-10-20 DIAGNOSIS — Z510 Encounter for antineoplastic radiation therapy: Secondary | ICD-10-CM | POA: Diagnosis not present

## 2020-10-20 DIAGNOSIS — C61 Malignant neoplasm of prostate: Secondary | ICD-10-CM | POA: Diagnosis not present

## 2020-10-21 ENCOUNTER — Ambulatory Visit
Admission: RE | Admit: 2020-10-21 | Discharge: 2020-10-21 | Disposition: A | Discharge status: Home / Self Care | Payer: Medicare PPO | Source: Ambulatory Visit | Attending: Radiation Oncology | Admitting: Radiation Oncology

## 2020-10-21 DIAGNOSIS — Z510 Encounter for antineoplastic radiation therapy: Secondary | ICD-10-CM | POA: Diagnosis not present

## 2020-10-21 DIAGNOSIS — C61 Malignant neoplasm of prostate: Secondary | ICD-10-CM | POA: Diagnosis not present

## 2020-10-22 ENCOUNTER — Ambulatory Visit: Payer: Medicare PPO

## 2020-10-23 ENCOUNTER — Ambulatory Visit
Admission: RE | Admit: 2020-10-23 | Discharge: 2020-10-23 | Disposition: A | Discharge status: Home / Self Care | Payer: Medicare PPO | Source: Ambulatory Visit | Attending: Radiation Oncology | Admitting: Radiation Oncology

## 2020-10-23 DIAGNOSIS — Z510 Encounter for antineoplastic radiation therapy: Secondary | ICD-10-CM | POA: Diagnosis not present

## 2020-10-23 DIAGNOSIS — C61 Malignant neoplasm of prostate: Secondary | ICD-10-CM | POA: Diagnosis not present

## 2020-10-24 ENCOUNTER — Other Ambulatory Visit: Payer: Self-pay

## 2020-10-24 ENCOUNTER — Ambulatory Visit
Admission: RE | Admit: 2020-10-24 | Discharge: 2020-10-24 | Disposition: A | Discharge status: Home / Self Care | Payer: Medicare PPO | Source: Ambulatory Visit | Attending: Radiation Oncology | Admitting: Radiation Oncology

## 2020-10-24 DIAGNOSIS — Z510 Encounter for antineoplastic radiation therapy: Secondary | ICD-10-CM | POA: Diagnosis not present

## 2020-10-24 DIAGNOSIS — C61 Malignant neoplasm of prostate: Secondary | ICD-10-CM | POA: Diagnosis not present

## 2020-10-27 ENCOUNTER — Other Ambulatory Visit: Payer: Self-pay

## 2020-10-27 ENCOUNTER — Ambulatory Visit
Admission: RE | Admit: 2020-10-27 | Discharge: 2020-10-27 | Disposition: A | Discharge status: Home / Self Care | Payer: Medicare PPO | Source: Ambulatory Visit | Attending: Radiation Oncology | Admitting: Radiation Oncology

## 2020-10-27 DIAGNOSIS — Z510 Encounter for antineoplastic radiation therapy: Secondary | ICD-10-CM | POA: Diagnosis not present

## 2020-10-27 DIAGNOSIS — C61 Malignant neoplasm of prostate: Secondary | ICD-10-CM | POA: Diagnosis not present

## 2020-10-28 ENCOUNTER — Ambulatory Visit
Admission: RE | Admit: 2020-10-28 | Discharge: 2020-10-28 | Disposition: A | Discharge status: Home / Self Care | Payer: Medicare PPO | Source: Ambulatory Visit | Attending: Radiation Oncology | Admitting: Radiation Oncology

## 2020-10-28 DIAGNOSIS — Z510 Encounter for antineoplastic radiation therapy: Secondary | ICD-10-CM | POA: Diagnosis not present

## 2020-10-28 DIAGNOSIS — C61 Malignant neoplasm of prostate: Secondary | ICD-10-CM | POA: Diagnosis not present

## 2020-10-29 ENCOUNTER — Ambulatory Visit
Admission: RE | Admit: 2020-10-29 | Discharge: 2020-10-29 | Disposition: A | Discharge status: Home / Self Care | Payer: Medicare PPO | Source: Ambulatory Visit | Attending: Radiation Oncology | Admitting: Radiation Oncology

## 2020-10-29 ENCOUNTER — Other Ambulatory Visit: Payer: Self-pay

## 2020-10-29 DIAGNOSIS — Z510 Encounter for antineoplastic radiation therapy: Secondary | ICD-10-CM | POA: Diagnosis not present

## 2020-10-29 DIAGNOSIS — C61 Malignant neoplasm of prostate: Secondary | ICD-10-CM | POA: Diagnosis not present

## 2020-10-30 ENCOUNTER — Other Ambulatory Visit: Payer: Self-pay

## 2020-10-30 ENCOUNTER — Ambulatory Visit
Admission: RE | Admit: 2020-10-30 | Discharge: 2020-10-30 | Disposition: A | Discharge status: Home / Self Care | Payer: Medicare PPO | Source: Ambulatory Visit | Attending: Radiation Oncology | Admitting: Radiation Oncology

## 2020-10-30 DIAGNOSIS — C61 Malignant neoplasm of prostate: Secondary | ICD-10-CM | POA: Diagnosis not present

## 2020-10-30 DIAGNOSIS — Z510 Encounter for antineoplastic radiation therapy: Secondary | ICD-10-CM | POA: Diagnosis not present

## 2020-10-31 ENCOUNTER — Ambulatory Visit
Admission: RE | Admit: 2020-10-31 | Discharge: 2020-10-31 | Disposition: A | Discharge status: Home / Self Care | Payer: Medicare PPO | Source: Ambulatory Visit | Attending: Radiation Oncology | Admitting: Radiation Oncology

## 2020-10-31 DIAGNOSIS — C61 Malignant neoplasm of prostate: Secondary | ICD-10-CM | POA: Diagnosis not present

## 2020-10-31 DIAGNOSIS — Z510 Encounter for antineoplastic radiation therapy: Secondary | ICD-10-CM | POA: Diagnosis not present

## 2020-11-03 ENCOUNTER — Ambulatory Visit
Admission: RE | Admit: 2020-11-03 | Discharge: 2020-11-03 | Disposition: A | Discharge status: Home / Self Care | Payer: Medicare PPO | Source: Ambulatory Visit | Attending: Radiation Oncology | Admitting: Radiation Oncology

## 2020-11-03 ENCOUNTER — Other Ambulatory Visit: Payer: Self-pay

## 2020-11-03 DIAGNOSIS — C61 Malignant neoplasm of prostate: Secondary | ICD-10-CM | POA: Diagnosis not present

## 2020-11-03 DIAGNOSIS — Z510 Encounter for antineoplastic radiation therapy: Secondary | ICD-10-CM | POA: Diagnosis not present

## 2020-11-04 ENCOUNTER — Ambulatory Visit
Admission: RE | Admit: 2020-11-04 | Discharge: 2020-11-04 | Disposition: A | Discharge status: Home / Self Care | Payer: Medicare PPO | Source: Ambulatory Visit | Attending: Radiation Oncology | Admitting: Radiation Oncology

## 2020-11-04 DIAGNOSIS — C61 Malignant neoplasm of prostate: Secondary | ICD-10-CM | POA: Diagnosis not present

## 2020-11-04 DIAGNOSIS — Z510 Encounter for antineoplastic radiation therapy: Secondary | ICD-10-CM | POA: Diagnosis not present

## 2020-11-05 ENCOUNTER — Other Ambulatory Visit: Payer: Self-pay

## 2020-11-05 ENCOUNTER — Ambulatory Visit
Admission: RE | Admit: 2020-11-05 | Discharge: 2020-11-05 | Disposition: A | Discharge status: Home / Self Care | Payer: Medicare PPO | Source: Ambulatory Visit | Attending: Radiation Oncology | Admitting: Radiation Oncology

## 2020-11-05 DIAGNOSIS — C61 Malignant neoplasm of prostate: Secondary | ICD-10-CM | POA: Diagnosis not present

## 2020-11-05 DIAGNOSIS — Z510 Encounter for antineoplastic radiation therapy: Secondary | ICD-10-CM | POA: Diagnosis not present

## 2020-11-06 ENCOUNTER — Ambulatory Visit
Admission: RE | Admit: 2020-11-06 | Discharge: 2020-11-06 | Disposition: A | Discharge status: Home / Self Care | Payer: Medicare PPO | Source: Ambulatory Visit | Attending: Radiation Oncology | Admitting: Radiation Oncology

## 2020-11-06 DIAGNOSIS — Z510 Encounter for antineoplastic radiation therapy: Secondary | ICD-10-CM | POA: Diagnosis not present

## 2020-11-06 DIAGNOSIS — C61 Malignant neoplasm of prostate: Secondary | ICD-10-CM | POA: Diagnosis not present

## 2020-11-07 ENCOUNTER — Ambulatory Visit
Admission: RE | Admit: 2020-11-07 | Discharge: 2020-11-07 | Disposition: A | Discharge status: Home / Self Care | Payer: Medicare PPO | Source: Ambulatory Visit | Attending: Radiation Oncology | Admitting: Radiation Oncology

## 2020-11-07 ENCOUNTER — Other Ambulatory Visit: Payer: Self-pay

## 2020-11-07 DIAGNOSIS — Z510 Encounter for antineoplastic radiation therapy: Secondary | ICD-10-CM | POA: Diagnosis not present

## 2020-11-07 DIAGNOSIS — C61 Malignant neoplasm of prostate: Secondary | ICD-10-CM | POA: Diagnosis not present

## 2020-11-10 ENCOUNTER — Ambulatory Visit
Admission: RE | Admit: 2020-11-10 | Discharge: 2020-11-10 | Disposition: A | Discharge status: Home / Self Care | Payer: Medicare PPO | Source: Ambulatory Visit | Attending: Radiation Oncology | Admitting: Radiation Oncology

## 2020-11-10 ENCOUNTER — Other Ambulatory Visit: Payer: Self-pay

## 2020-11-10 DIAGNOSIS — C61 Malignant neoplasm of prostate: Secondary | ICD-10-CM | POA: Diagnosis not present

## 2020-11-10 DIAGNOSIS — Z510 Encounter for antineoplastic radiation therapy: Secondary | ICD-10-CM | POA: Diagnosis not present

## 2020-11-11 ENCOUNTER — Ambulatory Visit
Admission: RE | Admit: 2020-11-11 | Discharge: 2020-11-11 | Disposition: A | Discharge status: Home / Self Care | Payer: Medicare PPO | Source: Ambulatory Visit | Attending: Radiation Oncology | Admitting: Radiation Oncology

## 2020-11-11 DIAGNOSIS — I10 Essential (primary) hypertension: Secondary | ICD-10-CM | POA: Diagnosis not present

## 2020-11-11 DIAGNOSIS — Z510 Encounter for antineoplastic radiation therapy: Secondary | ICD-10-CM | POA: Diagnosis not present

## 2020-11-11 DIAGNOSIS — C61 Malignant neoplasm of prostate: Secondary | ICD-10-CM | POA: Diagnosis not present

## 2020-11-12 ENCOUNTER — Ambulatory Visit
Admission: RE | Admit: 2020-11-12 | Discharge: 2020-11-12 | Disposition: A | Discharge status: Home / Self Care | Payer: Medicare PPO | Source: Ambulatory Visit | Attending: Radiation Oncology | Admitting: Radiation Oncology

## 2020-11-12 ENCOUNTER — Other Ambulatory Visit: Payer: Self-pay

## 2020-11-12 DIAGNOSIS — C61 Malignant neoplasm of prostate: Secondary | ICD-10-CM | POA: Diagnosis not present

## 2020-11-12 DIAGNOSIS — Z510 Encounter for antineoplastic radiation therapy: Secondary | ICD-10-CM | POA: Diagnosis not present

## 2020-11-13 ENCOUNTER — Ambulatory Visit
Admission: RE | Admit: 2020-11-13 | Discharge: 2020-11-13 | Disposition: A | Discharge status: Home / Self Care | Payer: Medicare PPO | Source: Ambulatory Visit | Attending: Radiation Oncology | Admitting: Radiation Oncology

## 2020-11-13 ENCOUNTER — Ambulatory Visit: Payer: Medicare PPO

## 2020-11-13 DIAGNOSIS — Z510 Encounter for antineoplastic radiation therapy: Secondary | ICD-10-CM | POA: Diagnosis not present

## 2020-11-13 DIAGNOSIS — C61 Malignant neoplasm of prostate: Secondary | ICD-10-CM | POA: Diagnosis not present

## 2020-11-14 ENCOUNTER — Ambulatory Visit
Admission: RE | Admit: 2020-11-14 | Discharge: 2020-11-14 | Disposition: A | Discharge status: Home / Self Care | Payer: Medicare PPO | Source: Ambulatory Visit | Attending: Radiation Oncology | Admitting: Radiation Oncology

## 2020-11-14 ENCOUNTER — Ambulatory Visit: Payer: Medicare PPO

## 2020-11-14 ENCOUNTER — Other Ambulatory Visit: Payer: Self-pay

## 2020-11-14 DIAGNOSIS — C61 Malignant neoplasm of prostate: Secondary | ICD-10-CM | POA: Diagnosis not present

## 2020-11-14 DIAGNOSIS — Z510 Encounter for antineoplastic radiation therapy: Secondary | ICD-10-CM | POA: Diagnosis not present

## 2020-11-17 ENCOUNTER — Encounter: Payer: Self-pay | Admitting: Urology

## 2020-11-17 ENCOUNTER — Ambulatory Visit
Admission: RE | Admit: 2020-11-17 | Discharge: 2020-11-17 | Disposition: A | Discharge status: Home / Self Care | Payer: Medicare PPO | Source: Ambulatory Visit | Attending: Radiation Oncology | Admitting: Radiation Oncology

## 2020-11-17 DIAGNOSIS — C61 Malignant neoplasm of prostate: Secondary | ICD-10-CM | POA: Insufficient documentation

## 2020-11-17 DIAGNOSIS — Z510 Encounter for antineoplastic radiation therapy: Secondary | ICD-10-CM | POA: Diagnosis not present

## 2020-11-25 ENCOUNTER — Telehealth: Payer: Self-pay | Admitting: Radiation Oncology

## 2020-11-25 NOTE — Telephone Encounter (Signed)
Received voicemail message from patient requesting return call. Phoned patient back promptly. Patient completed prostate radiation therapy on 11/17/2020. Patient reports diarrhea, rectal irritation and weakness. Instructed patient to begin taking Imodium. Reassured patient that diarrhea is a common side effect associated with radiation therapy and can persist for 2-3 weeks following radiation. Encouraged patient to begin using baby wipes with aloe, a sitz bath several times per day and consider applying preparation h for rectal irritation. Explained his weakness is most likely related to dehydration from diarrhea. Encouraged rest and hydration with electrolyte rich fluids. Patient verbalized understanding of all reviewed and appreciation for the call back.

## 2020-12-17 NOTE — Progress Notes (Signed)
  Radiation Oncology         (336) [phone] ________________________________  Name: Victor Garcia MRN: [id number]  Date: 11/17/2020  DOB: 17-Mar-1951  End of Treatment Note  Diagnosis:   70 y.o.gentleman with Stage T2aadenocarcinoma of the prostate with Gleason Score of 3+4, and PSA of6.48.     Indication for treatment:  Curative, Definitive Radiotherapy       Radiation treatment dates:   10/07/20 - 11/17/20  Site/dose:   The prostate was treated to 70 Gy in 28 fractions of 2.5 Gy  Beams/energy:   The patient was treated with IMRT using volumetric arc therapy delivering 6 MV X-rays to clockwise and counterclockwise circumferential arcs with a 90 degree collimator offset to avoid dose scalloping.  Image guidance was performed with daily cone beam CT prior to each fraction to align to gold markers in the prostate and assure proper bladder and rectal fill volumes.  Immobilization was achieved with BodyFix custom mold.  Narrative: The patient tolerated radiation treatment relatively well with only minor urinary irritation and modest fatigue.  He did experience increased frequency, urgency, weak flow of stream and nocturia x2.  He also reported loose stool/diarrhea but denied abdominal pain and did not require any medications for management.  Plan: The patient has completed radiation treatment. He will return to radiation oncology clinic for routine followup in one month. I advised him to call or return sooner if he has any questions or concerns related to his recovery or treatment. ________________________________  Sheral Apley. Tammi Klippel, M.D.

## 2020-12-17 NOTE — Progress Notes (Signed)
Radiation Oncology         (336) [phone] ________________________________  Name: Victor Garcia MRN: [id number]  Date: 12/18/2020  DOB: Dec 16, 1950  Post Treatment Note  CC: Lavone Orn, MD  Ardis Hughs, MD  Diagnosis:   70 y.o.gentleman with Stage T2aadenocarcinoma of the prostate with Gleason Score of 3+4, and PSA of6.48.  Interval Since Last Radiation:  4.5 weeks  10/07/20 - 11/17/20:   The prostate was treated to 70 Gy in 28 fractions of 2.5 Gy  Narrative: I spoke with the patient to conduct his routine scheduled 1 month follow up visit via telephone to spare the patient unnecessary potential exposure in the healthcare setting during the current COVID-19 pandemic.  The patient was notified in advance and gave permission to proceed with this visit format. He tolerated radiation treatment relatively well with only minor urinary irritation and modest fatigue.  He did experience increased frequency, urgency, weak flow of stream and nocturia x2.  He also reported loose stool/diarrhea but denied abdominal pain and did not require any medications for management.                              On review of systems, the patient states that he is doing very well in general.  He continues with somewhat weakened flow of stream, occasional intermittency, increased frequency and feelings of incomplete bladder emptying but reports that these are gradually improving.  He specifically denies dysuria, gross hematuria, straining to void, or incontinence.  He reports a healthy appetite and is maintaining his weight.  He denies abdominal pain, nausea, vomiting, diarrhea or constipation.  His energy level is almost back to normal at this point and overall, he is quite pleased with his progress to date.  ALLERGIES:  is allergic to hydrocodone, hydrocodone-acetaminophen, metformin, and other.  Meds: Current Outpatient Medications  Medication Sig Dispense Refill  . carvedilol (COREG) 12.5 MG tablet Take  12.5 mg by mouth daily.   4  . gabapentin (NEURONTIN) 300 MG capsule 3 capsules    . hydrocortisone 2.5 % cream 1 application    . hydrOXYzine (ATARAX/VISTARIL) 50 MG tablet Take 1 tablet by mouth at bedtime.    Marland Kitchen LOSARTAN POTASSIUM PO Take 1 tablet by mouth daily.    . pioglitazone (ACTOS) 30 MG tablet Take 1 tablet by mouth daily.    Marland Kitchen PRAVASTATIN SODIUM PO Take by mouth.    . terbinafine (LAMISIL) 1 % cream 1 application    . triamcinolone (KENALOG) 0.1 % 1 application     No current facility-administered medications for this visit.    Physical Findings:  vitals were not taken for this visit.   /10 Unable to assess due to telephone follow-up visit format.  Lab Findings: No results found for: WBC, HGB, HCT, MCV, PLT   Radiographic Findings: No results found.  Impression/Plan: 1. 70 y.o.gentleman with Stage T2aadenocarcinoma of the prostate with Gleason Score of 3+4, and PSA of6.48.  He will continue to follow up with urology for ongoing PSA determinations but does not currently have an appointment scheduled with Dr. Louis Meckel.  He did receive notification from [facility] recently that he should call to schedule his follow-up visit for sometime in late July or early August and he plans to take care of that this week.  He understands what to expect with regards to PSA monitoring going forward. I will look forward to following his response to treatment via correspondence with  urology, and would be happy to continue to participate in his care if clinically indicated. I talked to the patient about what to expect in the future, including his risk for erectile dysfunction and rectal bleeding. I encouraged him to call or return to the office if he has any questions regarding his previous radiation or possible radiation side effects. He was comfortable with this plan and will follow up as needed.    Nicholos Johns, PA-C

## 2020-12-18 ENCOUNTER — Encounter: Payer: Self-pay | Admitting: Urology

## 2020-12-18 ENCOUNTER — Ambulatory Visit
Admission: RE | Admit: 2020-12-18 | Discharge: 2020-12-18 | Disposition: A | Discharge status: Home / Self Care | Payer: Medicare PPO | Source: Ambulatory Visit | Attending: Urology | Admitting: Urology

## 2020-12-18 ENCOUNTER — Other Ambulatory Visit: Payer: Self-pay

## 2020-12-18 DIAGNOSIS — C61 Malignant neoplasm of prostate: Secondary | ICD-10-CM

## 2020-12-18 NOTE — Progress Notes (Signed)
Spoke with patient via phone meaningful use complete. AUA score is 5 denies any issues or complaints of at this time. Started a new BP medication.

## 2020-12-23 DIAGNOSIS — H401131 Primary open-angle glaucoma, bilateral, mild stage: Secondary | ICD-10-CM | POA: Diagnosis not present

## 2021-02-19 DIAGNOSIS — C61 Malignant neoplasm of prostate: Secondary | ICD-10-CM | POA: Diagnosis not present

## 2021-02-26 DIAGNOSIS — R3914 Feeling of incomplete bladder emptying: Secondary | ICD-10-CM | POA: Diagnosis not present

## 2021-02-26 DIAGNOSIS — Z8546 Personal history of malignant neoplasm of prostate: Secondary | ICD-10-CM | POA: Diagnosis not present

## 2021-02-26 DIAGNOSIS — R3912 Poor urinary stream: Secondary | ICD-10-CM | POA: Diagnosis not present

## 2021-03-13 DIAGNOSIS — E1169 Type 2 diabetes mellitus with other specified complication: Secondary | ICD-10-CM | POA: Diagnosis not present

## 2021-03-13 DIAGNOSIS — C61 Malignant neoplasm of prostate: Secondary | ICD-10-CM | POA: Diagnosis not present

## 2021-03-13 DIAGNOSIS — I10 Essential (primary) hypertension: Secondary | ICD-10-CM | POA: Diagnosis not present

## 2021-03-22 IMAGING — MR MR PROSTATE WO/W CM
12 series · 48 of 48 positions shown · IV contrast (multihance)
Comparison: Biopsy results of 07/22/2020.

CLINICAL DATA: Prostate cancer.

EXAM:
MR PROSTATE WITHOUT AND WITH CONTRAST
TECHNIQUE: Multiplanar multisequence MRI images were obtained of the pelvis
centered about the prostate. Pre and post contrast images were
obtained.
CONTRAST:  18mL MULTIHANCE GADOBENATE DIMEGLUMINE 529 MG/ML IV SOLN

[Series 3: T2 · coronal · 3.0mm · 0.56mm/px · 1 of 23 slices shown (1 of 3)]
[im 1/23]
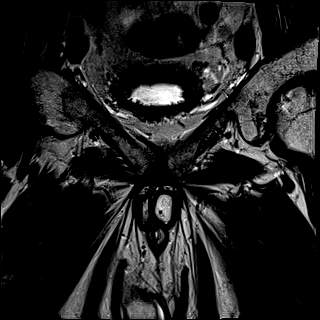

[Series 4: T1 · axial · 5.0mm · 1.25mm/px · 1 of 80 slices shown]
[im 1/80]
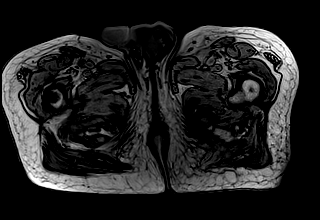

[Series 5: DWI · axial · 3.0mm · 1.75mm/px · z∈[-92,-35]mm · 2 of 60 slices shown (1 of 3)]
[im 1/60]
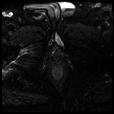
[im 60/60]
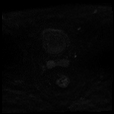

[Series 6: DWI · axial · 3.0mm · 1.75mm/px · 1 of 20 slices shown (2 of 3)]
[im 1/20]
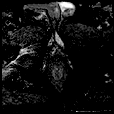

[Series 7: DWI · axial · 3.0mm · 1.75mm/px · 1 of 20 slices shown (3 of 3)]
[im 1/20]
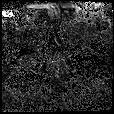

[Series 8: T2 · axial · 3.0mm · 0.56mm/px · 1 of 23 slices shown (2 of 3)]
[im 1/23]
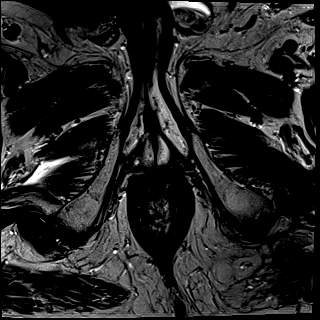

[Series 9: T2 · axial · 1.0mm · 1.04mm/px · z∈[-103,-24]mm · 2 of 80 slices shown (3 of 3)]
[im 1/80]
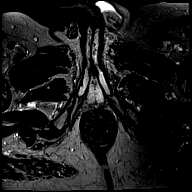
[im 80/80]
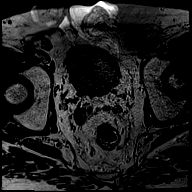

[Series 10: pre t1_twist_tra_dyn · axial · non-contrast · 3.5mm · 0.83mm/px · 1 of 20 slices shown]
[im 1/20]
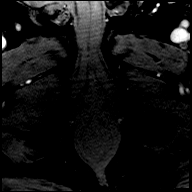

[Series 11: post t1_twist_tra_dyn-copy center · axial · non-contrast · 3.5mm · 0.83mm/px · z∈[-98,-31]mm · 17 of 600 slices shown]
[im 1/600]
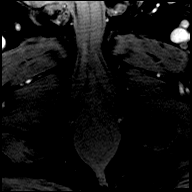
[im 38/600]
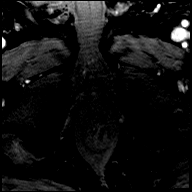
[im 75/600]
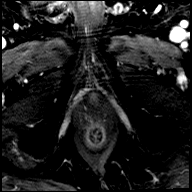
[im 113/600]
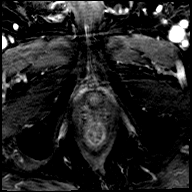
[im 150/600]
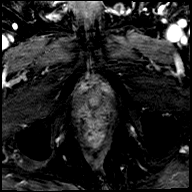
[im 188/600]
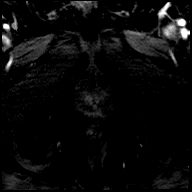
[im 225/600]
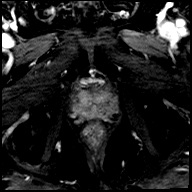
[im 263/600]
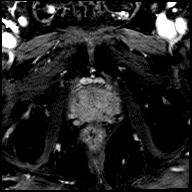
[im 300/600]
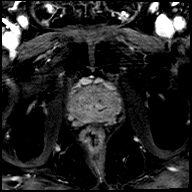
[im 337/600]
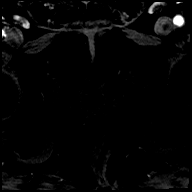
[im 375/600]
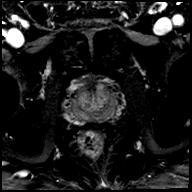
[im 412/600]
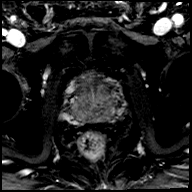
[im 450/600]
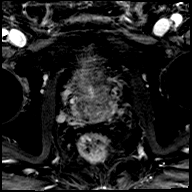
[im 487/600]
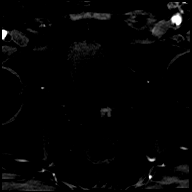
[im 525/600]
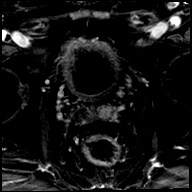
[im 562/600]
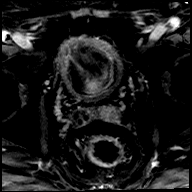
[im 600/600]
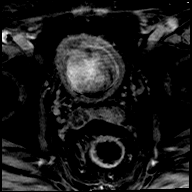

[Series 12: post t1_twist_tra_dyn-copy cent_sub · axial · 3.5mm · 0.83mm/px · z∈[-98,-31]mm · 17 of 579 slices shown]
[im 1/579]
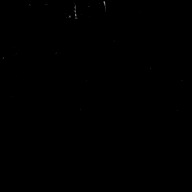
[im 37/579]
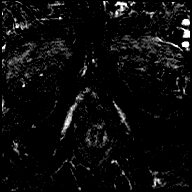
[im 73/579]
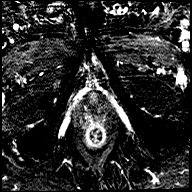
[im 109/579]
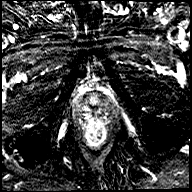
[im 145/579]
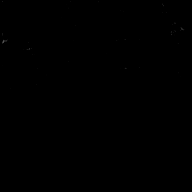
[im 181/579]
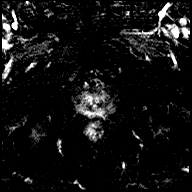
[im 217/579]
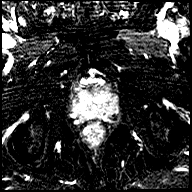
[im 253/579]
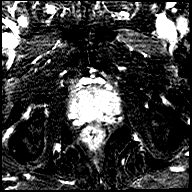
[im 290/579]
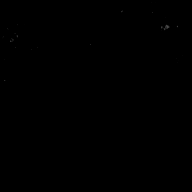
[im 326/579]
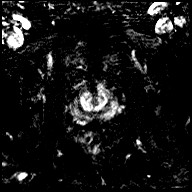
[im 362/579]
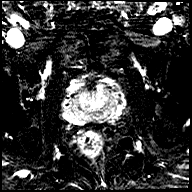
[im 398/579]
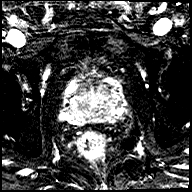
[im 434/579]
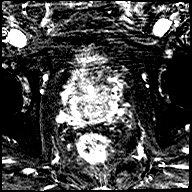
[im 470/579]
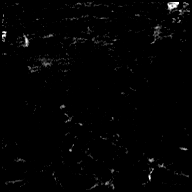
[im 506/579]
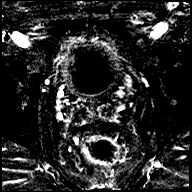
[im 542/579]
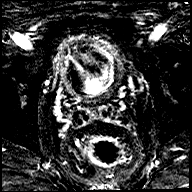
[im 579/579]
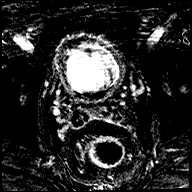

[Series 13: t1_vibe_dixon_tra_f · axial · 2.5mm · 0.91mm/px · z∈[-121,+76]mm · 2 of 80 slices shown]
[im 1/80]
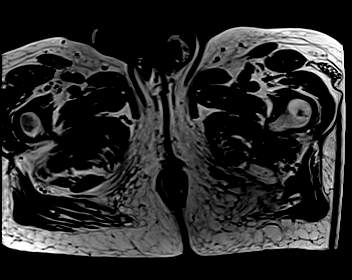
[im 80/80]
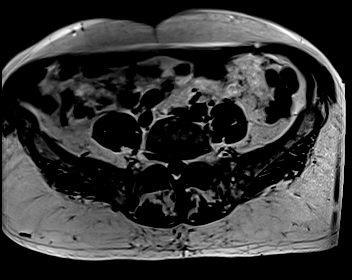

[Series 14: t1_vibe_dixon_tra_w · axial · 2.5mm · 0.91mm/px · z∈[-121,+76]mm · 2 of 80 slices shown]
[im 1/80]
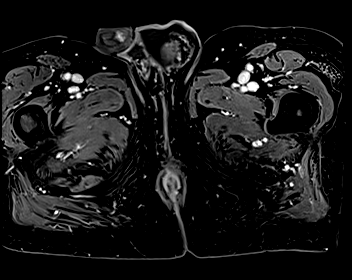
[im 80/80]
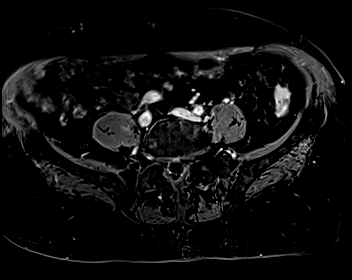

[48 of 48 positions shown; findings below may reference images not displayed]

This demonstrates Gleason
3+4=7 disease including at the left base laterally, right mid gland
and right apex.
FINDINGS: Prostate: Mild central gland enlargement and heterogeneity,
consistent with benign prostatic hyperplasia. No dominant central
gland nodule. There is heterogeneous T2 hypointensity throughout the
peripheral zone diffusely. The only area of masslike T2
hypointensity involves the lateral right mid to apical gland,
including at up to 1.0 cm on images 14 through 16 of series 8.
Corresponds to decreased signal on ADC map including [DATE] and subtle
hyperintensity on long B value diffusion weighted image [DATE].
PI-RADS(v2.1)-4.

Relatively symmetric nonspecific early post-contrast enhancement
within the peripheral zone at the midgland level including on image

Volume: 3.9 x 3.1 x 3.5 cm (volume = 22 cm^3)

Transcapsular spread: Possible small volume trans capsular spread,
as evidenced by irregularity of the lateral capsule at the level of
the right mid to apical gland lesion. Example [DATE].

Seminal vesicle involvement: Absent. Note is made of presumed
hemorrhage within the left seminal vesicle on [DATE]. Likely biopsy
related.

Neurovascular bundle involvement: Absent

Pelvic adenopathy: Absent

Bone metastasis: Absent

Other findings: No significant free fluid. Small fat containing left
inguinal hernia. Normal urinary bladder. Scattered colonic
diverticula.
IMPRESSION: 1.0 cm multiparametric signal abnormality within the right lateral
mid to apical peripheral zone, consistent with higher grade disease.
PI-RADS(v2.1) -4. Possible small volume trans capsular spread as
detailed above.

## 2021-03-24 DIAGNOSIS — H2513 Age-related nuclear cataract, bilateral: Secondary | ICD-10-CM | POA: Diagnosis not present

## 2021-03-24 DIAGNOSIS — H401131 Primary open-angle glaucoma, bilateral, mild stage: Secondary | ICD-10-CM | POA: Diagnosis not present

## 2021-07-06 DIAGNOSIS — H401131 Primary open-angle glaucoma, bilateral, mild stage: Secondary | ICD-10-CM | POA: Diagnosis not present

## 2021-07-06 DIAGNOSIS — E119 Type 2 diabetes mellitus without complications: Secondary | ICD-10-CM | POA: Diagnosis not present

## 2021-08-27 DIAGNOSIS — Z8546 Personal history of malignant neoplasm of prostate: Secondary | ICD-10-CM | POA: Diagnosis not present

## 2021-09-03 DIAGNOSIS — Z8546 Personal history of malignant neoplasm of prostate: Secondary | ICD-10-CM | POA: Diagnosis not present

## 2021-09-03 DIAGNOSIS — N5201 Erectile dysfunction due to arterial insufficiency: Secondary | ICD-10-CM | POA: Diagnosis not present

## 2021-09-03 DIAGNOSIS — R3915 Urgency of urination: Secondary | ICD-10-CM | POA: Diagnosis not present

## 2021-09-28 DIAGNOSIS — C61 Malignant neoplasm of prostate: Secondary | ICD-10-CM | POA: Diagnosis not present

## 2021-09-28 DIAGNOSIS — R208 Other disturbances of skin sensation: Secondary | ICD-10-CM | POA: Diagnosis not present

## 2021-09-28 DIAGNOSIS — K219 Gastro-esophageal reflux disease without esophagitis: Secondary | ICD-10-CM | POA: Diagnosis not present

## 2021-09-28 DIAGNOSIS — Z0000 Encounter for general adult medical examination without abnormal findings: Secondary | ICD-10-CM | POA: Diagnosis not present

## 2021-09-28 DIAGNOSIS — E782 Mixed hyperlipidemia: Secondary | ICD-10-CM | POA: Diagnosis not present

## 2021-09-28 DIAGNOSIS — Z1389 Encounter for screening for other disorder: Secondary | ICD-10-CM | POA: Diagnosis not present

## 2021-09-28 DIAGNOSIS — I10 Essential (primary) hypertension: Secondary | ICD-10-CM | POA: Diagnosis not present

## 2021-09-28 DIAGNOSIS — E1169 Type 2 diabetes mellitus with other specified complication: Secondary | ICD-10-CM | POA: Diagnosis not present

## 2021-09-28 DIAGNOSIS — Z23 Encounter for immunization: Secondary | ICD-10-CM | POA: Diagnosis not present

## 2021-10-13 DIAGNOSIS — H401131 Primary open-angle glaucoma, bilateral, mild stage: Secondary | ICD-10-CM | POA: Diagnosis not present

## 2021-10-13 DIAGNOSIS — H2513 Age-related nuclear cataract, bilateral: Secondary | ICD-10-CM | POA: Diagnosis not present

## 2022-01-15 DIAGNOSIS — K649 Unspecified hemorrhoids: Secondary | ICD-10-CM | POA: Diagnosis not present

## 2022-01-15 DIAGNOSIS — K529 Noninfective gastroenteritis and colitis, unspecified: Secondary | ICD-10-CM | POA: Diagnosis not present

## 2022-02-12 DIAGNOSIS — H401131 Primary open-angle glaucoma, bilateral, mild stage: Secondary | ICD-10-CM | POA: Diagnosis not present

## 2022-02-23 DIAGNOSIS — Z8546 Personal history of malignant neoplasm of prostate: Secondary | ICD-10-CM | POA: Diagnosis not present

## 2022-03-01 DIAGNOSIS — R109 Unspecified abdominal pain: Secondary | ICD-10-CM | POA: Diagnosis not present

## 2022-03-01 DIAGNOSIS — R42 Dizziness and giddiness: Secondary | ICD-10-CM | POA: Diagnosis not present

## 2022-03-02 DIAGNOSIS — N5201 Erectile dysfunction due to arterial insufficiency: Secondary | ICD-10-CM | POA: Diagnosis not present

## 2022-03-02 DIAGNOSIS — R3912 Poor urinary stream: Secondary | ICD-10-CM | POA: Diagnosis not present

## 2022-03-02 DIAGNOSIS — Z8546 Personal history of malignant neoplasm of prostate: Secondary | ICD-10-CM | POA: Diagnosis not present

## 2022-03-16 DIAGNOSIS — R55 Syncope and collapse: Secondary | ICD-10-CM | POA: Diagnosis not present

## 2022-03-16 DIAGNOSIS — E1169 Type 2 diabetes mellitus with other specified complication: Secondary | ICD-10-CM | POA: Diagnosis not present

## 2022-03-16 DIAGNOSIS — R634 Abnormal weight loss: Secondary | ICD-10-CM | POA: Diagnosis not present

## 2022-03-16 DIAGNOSIS — D649 Anemia, unspecified: Secondary | ICD-10-CM | POA: Diagnosis not present

## 2022-03-16 DIAGNOSIS — K921 Melena: Secondary | ICD-10-CM | POA: Diagnosis not present

## 2022-03-23 DIAGNOSIS — D649 Anemia, unspecified: Secondary | ICD-10-CM | POA: Diagnosis not present

## 2022-03-23 DIAGNOSIS — K921 Melena: Secondary | ICD-10-CM | POA: Diagnosis not present

## 2022-03-23 DIAGNOSIS — R42 Dizziness and giddiness: Secondary | ICD-10-CM | POA: Diagnosis not present

## 2022-04-07 DIAGNOSIS — E538 Deficiency of other specified B group vitamins: Secondary | ICD-10-CM | POA: Diagnosis not present

## 2022-04-14 DIAGNOSIS — E538 Deficiency of other specified B group vitamins: Secondary | ICD-10-CM | POA: Diagnosis not present

## 2022-04-16 DIAGNOSIS — R634 Abnormal weight loss: Secondary | ICD-10-CM | POA: Diagnosis not present

## 2022-04-16 DIAGNOSIS — E538 Deficiency of other specified B group vitamins: Secondary | ICD-10-CM | POA: Diagnosis not present

## 2022-04-16 DIAGNOSIS — I10 Essential (primary) hypertension: Secondary | ICD-10-CM | POA: Diagnosis not present

## 2022-04-16 DIAGNOSIS — E1169 Type 2 diabetes mellitus with other specified complication: Secondary | ICD-10-CM | POA: Diagnosis not present

## 2022-04-16 DIAGNOSIS — Z23 Encounter for immunization: Secondary | ICD-10-CM | POA: Diagnosis not present

## 2022-04-16 DIAGNOSIS — D539 Nutritional anemia, unspecified: Secondary | ICD-10-CM | POA: Diagnosis not present

## 2022-04-16 DIAGNOSIS — R42 Dizziness and giddiness: Secondary | ICD-10-CM | POA: Diagnosis not present

## 2022-04-21 DIAGNOSIS — E538 Deficiency of other specified B group vitamins: Secondary | ICD-10-CM | POA: Diagnosis not present

## 2022-04-28 DIAGNOSIS — E538 Deficiency of other specified B group vitamins: Secondary | ICD-10-CM | POA: Diagnosis not present

## 2022-05-05 DIAGNOSIS — R197 Diarrhea, unspecified: Secondary | ICD-10-CM | POA: Diagnosis not present

## 2022-05-05 DIAGNOSIS — D519 Vitamin B12 deficiency anemia, unspecified: Secondary | ICD-10-CM | POA: Diagnosis not present

## 2022-05-05 DIAGNOSIS — R634 Abnormal weight loss: Secondary | ICD-10-CM | POA: Diagnosis not present

## 2022-05-05 DIAGNOSIS — Z86010 Personal history of colonic polyps: Secondary | ICD-10-CM | POA: Diagnosis not present

## 2022-05-05 DIAGNOSIS — K648 Other hemorrhoids: Secondary | ICD-10-CM | POA: Diagnosis not present

## 2022-05-05 DIAGNOSIS — Z8546 Personal history of malignant neoplasm of prostate: Secondary | ICD-10-CM | POA: Diagnosis not present

## 2022-05-05 DIAGNOSIS — K625 Hemorrhage of anus and rectum: Secondary | ICD-10-CM | POA: Diagnosis not present

## 2022-05-05 DIAGNOSIS — R1013 Epigastric pain: Secondary | ICD-10-CM | POA: Diagnosis not present

## 2022-05-11 DIAGNOSIS — R197 Diarrhea, unspecified: Secondary | ICD-10-CM | POA: Diagnosis not present

## 2022-06-02 DIAGNOSIS — E538 Deficiency of other specified B group vitamins: Secondary | ICD-10-CM | POA: Diagnosis not present

## 2022-06-07 DIAGNOSIS — Z8719 Personal history of other diseases of the digestive system: Secondary | ICD-10-CM | POA: Diagnosis not present

## 2022-06-07 DIAGNOSIS — K649 Unspecified hemorrhoids: Secondary | ICD-10-CM | POA: Diagnosis not present

## 2022-06-07 DIAGNOSIS — Z8619 Personal history of other infectious and parasitic diseases: Secondary | ICD-10-CM | POA: Diagnosis not present

## 2022-06-07 DIAGNOSIS — R195 Other fecal abnormalities: Secondary | ICD-10-CM | POA: Diagnosis not present

## 2022-06-17 DIAGNOSIS — H401122 Primary open-angle glaucoma, left eye, moderate stage: Secondary | ICD-10-CM | POA: Diagnosis not present

## 2022-06-21 ENCOUNTER — Encounter: Payer: Self-pay | Admitting: Internal Medicine

## 2022-06-29 DIAGNOSIS — R195 Other fecal abnormalities: Secondary | ICD-10-CM | POA: Diagnosis not present

## 2022-07-05 DIAGNOSIS — E538 Deficiency of other specified B group vitamins: Secondary | ICD-10-CM | POA: Diagnosis not present

## 2022-07-06 DIAGNOSIS — K649 Unspecified hemorrhoids: Secondary | ICD-10-CM | POA: Diagnosis not present

## 2022-07-06 DIAGNOSIS — R634 Abnormal weight loss: Secondary | ICD-10-CM | POA: Diagnosis not present

## 2022-07-06 DIAGNOSIS — D582 Other hemoglobinopathies: Secondary | ICD-10-CM | POA: Diagnosis not present

## 2022-07-06 DIAGNOSIS — K625 Hemorrhage of anus and rectum: Secondary | ICD-10-CM | POA: Diagnosis not present

## 2022-07-06 DIAGNOSIS — R630 Anorexia: Secondary | ICD-10-CM | POA: Diagnosis not present

## 2022-07-06 DIAGNOSIS — D519 Vitamin B12 deficiency anemia, unspecified: Secondary | ICD-10-CM | POA: Diagnosis not present

## 2022-07-06 DIAGNOSIS — A0472 Enterocolitis due to Clostridium difficile, not specified as recurrent: Secondary | ICD-10-CM | POA: Diagnosis not present

## 2022-07-19 ENCOUNTER — Inpatient Hospital Stay (HOSPITAL_COMMUNITY)
Admission: EM | Admit: 2022-07-19 | Discharge: 2022-07-23 | DRG: 378 | Disposition: A | Discharge status: Home / Self Care | Payer: Medicare PPO | Attending: Family Medicine | Admitting: Family Medicine

## 2022-07-19 ENCOUNTER — Emergency Department (HOSPITAL_COMMUNITY): Payer: Medicare PPO

## 2022-07-19 ENCOUNTER — Encounter (HOSPITAL_COMMUNITY): Payer: Self-pay

## 2022-07-19 ENCOUNTER — Other Ambulatory Visit: Payer: Self-pay

## 2022-07-19 DIAGNOSIS — Z79899 Other long term (current) drug therapy: Secondary | ICD-10-CM

## 2022-07-19 DIAGNOSIS — N179 Acute kidney failure, unspecified: Secondary | ICD-10-CM | POA: Diagnosis not present

## 2022-07-19 DIAGNOSIS — I10 Essential (primary) hypertension: Secondary | ICD-10-CM | POA: Diagnosis present

## 2022-07-19 DIAGNOSIS — K219 Gastro-esophageal reflux disease without esophagitis: Secondary | ICD-10-CM | POA: Diagnosis present

## 2022-07-19 DIAGNOSIS — R197 Diarrhea, unspecified: Secondary | ICD-10-CM | POA: Diagnosis not present

## 2022-07-19 DIAGNOSIS — K2900 Acute gastritis without bleeding: Secondary | ICD-10-CM | POA: Diagnosis present

## 2022-07-19 DIAGNOSIS — K640 First degree hemorrhoids: Secondary | ICD-10-CM | POA: Diagnosis present

## 2022-07-19 DIAGNOSIS — Z888 Allergy status to other drugs, medicaments and biological substances status: Secondary | ICD-10-CM

## 2022-07-19 DIAGNOSIS — D649 Anemia, unspecified: Secondary | ICD-10-CM | POA: Diagnosis not present

## 2022-07-19 DIAGNOSIS — Z923 Personal history of irradiation: Secondary | ICD-10-CM

## 2022-07-19 DIAGNOSIS — J101 Influenza due to other identified influenza virus with other respiratory manifestations: Secondary | ICD-10-CM

## 2022-07-19 DIAGNOSIS — C61 Malignant neoplasm of prostate: Secondary | ICD-10-CM | POA: Diagnosis present

## 2022-07-19 DIAGNOSIS — K922 Gastrointestinal hemorrhage, unspecified: Secondary | ICD-10-CM | POA: Diagnosis present

## 2022-07-19 DIAGNOSIS — Z8619 Personal history of other infectious and parasitic diseases: Secondary | ICD-10-CM

## 2022-07-19 DIAGNOSIS — Z885 Allergy status to narcotic agent status: Secondary | ICD-10-CM

## 2022-07-19 DIAGNOSIS — E1169 Type 2 diabetes mellitus with other specified complication: Secondary | ICD-10-CM | POA: Diagnosis present

## 2022-07-19 DIAGNOSIS — R64 Cachexia: Secondary | ICD-10-CM | POA: Diagnosis present

## 2022-07-19 DIAGNOSIS — Z6823 Body mass index (BMI) 23.0-23.9, adult: Secondary | ICD-10-CM

## 2022-07-19 DIAGNOSIS — Z1152 Encounter for screening for COVID-19: Secondary | ICD-10-CM

## 2022-07-19 DIAGNOSIS — K921 Melena: Principal | ICD-10-CM | POA: Diagnosis present

## 2022-07-19 DIAGNOSIS — Z803 Family history of malignant neoplasm of breast: Secondary | ICD-10-CM

## 2022-07-19 DIAGNOSIS — K5710 Diverticulosis of small intestine without perforation or abscess without bleeding: Secondary | ICD-10-CM | POA: Diagnosis present

## 2022-07-19 DIAGNOSIS — E876 Hypokalemia: Secondary | ICD-10-CM | POA: Diagnosis present

## 2022-07-19 DIAGNOSIS — D62 Acute posthemorrhagic anemia: Secondary | ICD-10-CM | POA: Diagnosis present

## 2022-07-19 DIAGNOSIS — D72819 Decreased white blood cell count, unspecified: Secondary | ICD-10-CM | POA: Diagnosis present

## 2022-07-19 DIAGNOSIS — E860 Dehydration: Secondary | ICD-10-CM | POA: Diagnosis present

## 2022-07-19 DIAGNOSIS — Z8042 Family history of malignant neoplasm of prostate: Secondary | ICD-10-CM

## 2022-07-19 DIAGNOSIS — G4733 Obstructive sleep apnea (adult) (pediatric): Secondary | ICD-10-CM | POA: Diagnosis present

## 2022-07-19 DIAGNOSIS — Z91199 Patient's noncompliance with other medical treatment and regimen due to unspecified reason: Secondary | ICD-10-CM

## 2022-07-19 LAB — CBC WITH DIFFERENTIAL/PLATELET
Abs Immature Granulocytes: 0.01 10*3/uL (ref 0.00–0.07)
Basophils Absolute: 0 10*3/uL (ref 0.0–0.1)
Basophils Relative: 0 %
Eosinophils Absolute: 0.1 10*3/uL (ref 0.0–0.5)
Eosinophils Relative: 3 %
HCT: 25.5 % — ABNORMAL LOW (ref 39.0–52.0)
Hemoglobin: 8.1 g/dL — ABNORMAL LOW (ref 13.0–17.0)
Immature Granulocytes: 0 %
Lymphocytes Relative: 20 %
Lymphs Abs: 0.7 10*3/uL (ref 0.7–4.0)
MCH: 30.8 pg (ref 26.0–34.0)
MCHC: 31.8 g/dL (ref 30.0–36.0)
MCV: 97 fL (ref 80.0–100.0)
Monocytes Absolute: 0.5 10*3/uL (ref 0.1–1.0)
Monocytes Relative: 15 %
Neutro Abs: 2.2 10*3/uL (ref 1.7–7.7)
Neutrophils Relative %: 62 %
Platelets: 221 10*3/uL (ref 150–400)
RBC: 2.63 MIL/uL — ABNORMAL LOW (ref 4.22–5.81)
RDW: 13.6 % (ref 11.5–15.5)
WBC: 3.6 10*3/uL — ABNORMAL LOW (ref 4.0–10.5)
nRBC: 0 % (ref 0.0–0.2)

## 2022-07-19 LAB — COMPREHENSIVE METABOLIC PANEL
ALT: 15 U/L (ref 0–44)
AST: 38 U/L (ref 15–41)
Albumin: 3 g/dL — ABNORMAL LOW (ref 3.5–5.0)
Alkaline Phosphatase: 45 U/L (ref 38–126)
Anion gap: 13 (ref 5–15)
BUN: 18 mg/dL (ref 8–23)
CO2: 18 mmol/L — ABNORMAL LOW (ref 22–32)
Calcium: 5.6 mg/dL — CL (ref 8.9–10.3)
Chloride: 105 mmol/L (ref 98–111)
Creatinine, Ser: 2.15 mg/dL — ABNORMAL HIGH (ref 0.61–1.24)
GFR, Estimated: 32 mL/min — ABNORMAL LOW (ref >60)
Glucose, Bld: 177 mg/dL — ABNORMAL HIGH (ref 70–99)
Potassium: 3.5 mmol/L (ref 3.5–5.1)
Sodium: 136 mmol/L (ref 135–145)
Total Bilirubin: 0.4 mg/dL (ref 0.3–1.2)
Total Protein: 6.6 g/dL (ref 6.5–8.1)

## 2022-07-19 LAB — BASIC METABOLIC PANEL
Anion gap: 12 (ref 5–15)
BUN: 19 mg/dL (ref 8–23)
CO2: 19 mmol/L — ABNORMAL LOW (ref 22–32)
Calcium: 5.8 mg/dL — CL (ref 8.9–10.3)
Chloride: 106 mmol/L (ref 98–111)
Creatinine, Ser: 2.11 mg/dL — ABNORMAL HIGH (ref 0.61–1.24)
GFR, Estimated: 32 mL/min — ABNORMAL LOW (ref >60)
Glucose, Bld: 144 mg/dL — ABNORMAL HIGH (ref 70–99)
Potassium: 3.3 mmol/L — ABNORMAL LOW (ref 3.5–5.1)
Sodium: 137 mmol/L (ref 135–145)

## 2022-07-19 LAB — TSH: TSH: 0.786 u[IU]/mL (ref 0.350–4.500)

## 2022-07-19 LAB — C DIFFICILE QUICK SCREEN W PCR REFLEX
C Diff antigen: NEGATIVE
C Diff interpretation: NOT DETECTED
C Diff toxin: NEGATIVE

## 2022-07-19 LAB — RESP PANEL BY RT-PCR (RSV, FLU A&B, COVID)  RVPGX2
Influenza A by PCR: POSITIVE — AB
Influenza B by PCR: NEGATIVE
Resp Syncytial Virus by PCR: NEGATIVE
SARS Coronavirus 2 by RT PCR: NEGATIVE

## 2022-07-19 LAB — CBG MONITORING, ED: Glucose-Capillary: 152 mg/dL — ABNORMAL HIGH (ref 70–99)

## 2022-07-19 LAB — MAGNESIUM: Magnesium: 0.9 mg/dL — CL (ref 1.7–2.4)

## 2022-07-19 MED ORDER — OSELTAMIVIR PHOSPHATE 30 MG PO CAPS
30.0000 mg | ORAL_CAPSULE | Freq: Two times a day (BID) | ORAL | Status: DC
  Administered 2022-07-19 – 2022-07-23 (×8): 30 mg via ORAL
  Filled 2022-07-19 (×9): qty 1

## 2022-07-19 MED ORDER — MAGNESIUM SULFATE 2 GM/50ML IV SOLN
2.0000 g | Freq: Once | INTRAVENOUS | Status: AC
  Administered 2022-07-19: 2 g via INTRAVENOUS
  Filled 2022-07-19: qty 50

## 2022-07-19 MED ORDER — POTASSIUM CHLORIDE 10 MEQ/100ML IV SOLN
10.0000 meq | INTRAVENOUS | Status: AC
  Administered 2022-07-19 (×2): 10 meq via INTRAVENOUS
  Filled 2022-07-19 (×2): qty 100

## 2022-07-19 MED ORDER — LACTATED RINGERS IV SOLN: INTRAVENOUS | Status: AC

## 2022-07-19 MED ORDER — CALCIUM GLUCONATE-NACL 2-0.675 GM/100ML-% IV SOLN
2.0000 g | Freq: Once | INTRAVENOUS | Status: AC
  Administered 2022-07-19: 2000 mg via INTRAVENOUS
  Filled 2022-07-19: qty 100

## 2022-07-19 MED ORDER — SODIUM CHLORIDE 0.9 % IV BOLUS
1000.0000 mL | Freq: Once | INTRAVENOUS | Status: AC
  Administered 2022-07-19: 1000 mL via INTRAVENOUS

## 2022-07-19 NOTE — ED Provider Notes (Signed)
[facility] DEPT Provider Note   CSN: [id number] Arrival date & time: 07/19/22  1515     History  Chief Complaint  Patient presents with   Weakness    Victor Garcia is a 72 y.o. male hx of HTN, prostate cancer status post radiation here presenting with weakness and cough and decreased energy.  Patient states that for the last 2 to 3 days, he has some cough and has poor energy.  He states that he just feels very tired.  Denies any fevers.  Patient states that he he had family over during Christmas but denies any known sick contacts.  Patient states that he has prostate cancer.  He states that he was supposed to be on calcium but has not been taking it since the summer.  The history is provided by the patient.       Home Medications Prior to Admission medications   Medication Sig Start Date End Date Taking? Authorizing Provider  amLODipine (NORVASC) 10 MG tablet Take 10 mg by mouth once. 09/22/20   [provider]  carvedilol (COREG) 12.5 MG tablet Take 12.5 mg by mouth daily.  08/26/14   [provider]  gabapentin (NEURONTIN) 300 MG capsule 3 capsules    [provider]  hydrocortisone 2.5 % cream 1 application 09/17/33   [provider]  hydrOXYzine (ATARAX/VISTARIL) 50 MG tablet Take 1 tablet by mouth at bedtime.    [provider]  LOSARTAN POTASSIUM PO Take 1 tablet by mouth daily.    [provider]  pioglitazone (ACTOS) 30 MG tablet Take 1 tablet by mouth daily.    [provider]  PRAVASTATIN SODIUM PO Take by mouth.    [provider]  terbinafine (LAMISIL) 1 % cream 1 application 11/23/30   [provider]  triamcinolone (KENALOG) 0.1 % 1 application 20/25/42   [provider]      Allergies    Hydrocodone, Hydrocodone-acetaminophen, Metformin, and Other    Review of Systems   Review of Systems  Neurological:  Positive for weakness.  All other systems  reviewed and are negative.   Physical Exam Updated Vital Signs BP 100/76 (BP Location: Left Arm)   Pulse 95   Resp 19   SpO2 100%  Physical Exam Vitals and nursing note reviewed.  Constitutional:      Appearance: Normal appearance.  HENT:     Head: Normocephalic.     Nose: Nose normal.     Mouth/Throat:     Mouth: Mucous membranes are dry.  Eyes:     Extraocular Movements: Extraocular movements intact.     Pupils: Pupils are equal, round, and reactive to light.  Cardiovascular:     Rate and Rhythm: Normal rate and regular rhythm.     Pulses: Normal pulses.     Heart sounds: Normal heart sounds.  Pulmonary:     Effort: Pulmonary effort is normal.     Comments: Diminished bilaterally and no wheezing or crackles Abdominal:     General: Abdomen is flat.     Palpations: Abdomen is soft.  Musculoskeletal:        General: Normal range of motion.     Cervical back: Normal range of motion and neck supple.     Comments: ? Increased reflexes, no obvious Chovstek sign   Skin:    General: Skin is warm.     Capillary Refill: Capillary refill takes less than 2 seconds.  Neurological:     General:  No focal deficit present.     Mental Status: He is alert.  Psychiatric:        Mood and Affect: Mood normal.        Behavior: Behavior normal.     ED Results / Procedures / Treatments   Labs (all labs ordered are listed, but only abnormal results are displayed) Labs Reviewed  RESP PANEL BY RT-PCR (RSV, FLU A&B, COVID)  RVPGX2 - Abnormal; Notable for the following components:      Result Value   Influenza A by PCR POSITIVE (*)    All other components within normal limits  CBC WITH DIFFERENTIAL/PLATELET - Abnormal; Notable for the following components:   WBC 3.6 (*)    RBC 2.63 (*)    Hemoglobin 8.1 (*)    HCT 25.5 (*)    All other components within normal limits  COMPREHENSIVE METABOLIC PANEL - Abnormal; Notable for the following components:   CO2 18 (*)    Glucose, Bld 177 (*)     Creatinine, Ser 2.15 (*)    Calcium 5.6 (*)    Albumin 3.0 (*)    GFR, Estimated 32 (*)    All other components within normal limits  CBG MONITORING, ED - Abnormal; Notable for the following components:   Glucose-Capillary 152 (*)    All other components within normal limits  TSH  BASIC METABOLIC PANEL  CALCIUM, IONIZED  PARATHYROID HORMONE, INTACT (NO CA)  PTH-RELATED PEPTIDE  MAGNESIUM    EKG EKG Interpretation  Date/Time:  Monday July 19 2022 15:40:13 EST Ventricular Rate:  95 PR Interval:  128 QRS Duration: 88 QT Interval:  367 QTC Calculation: 462 R Axis:   34 Text Interpretation: Sinus rhythm Abnormal R-wave progression, early transition Borderline T wave abnormalities Confirmed by Regan Lemming ([id number]) on 07/19/2022 3:47:28 PM  Radiology No results found.  Procedures Procedures    CRITICAL CARE Performed by: Wandra Arthurs   Total critical care time: 30 minutes  Critical care time was exclusive of separately billable procedures and treating other patients.  Critical care was necessary to treat or prevent imminent or life-threatening deterioration.  Critical care was time spent personally by me on the following activities: development of treatment plan with patient and/or surrogate as well as nursing, discussions with consultants, evaluation of patient's response to treatment, examination of patient, obtaining history from patient or surrogate, ordering and performing treatments and interventions, ordering and review of laboratory studies, ordering and review of radiographic studies, pulse oximetry and re-evaluation of patient's condition.   Medications Ordered in ED Medications  calcium gluconate 2 g/ 100 mL sodium chloride IVPB (has no administration in time range)  sodium chloride 0.9 % bolus 1,000 mL (has no administration in time range)    ED Course/ Medical Decision Making/ A&P                           Medical Decision Making Victor Garcia is a  72 y.o. male here presenting with weakness.  Patient has diffuse weakness and nonproductive cough.  Consider flu versus electrolyte abnormalities versus pneumonia.  Plan to get CBC and CMP and chest x-ray.  5:29 PM Patient's calcium level is 5.9.  Patient also has acute renal failure with creatinine of 2.1.  Patient is supposed to be on calcium supplementation but has not been taking it. I will Garcia thyroid level and PTH. Also Flu positive which likely contribute to his symptoms.  Patient may be  outside the window for Tamiflu and patient also has AKI so I hesitate to give him Tamiflu.  I ordered 2 g of calcium and patient will need to be admitted.  6:39 PM Repeat BMP calcium of 5.8.  Patient ordered 2 g of calcium.  Hospitalist to admit.  TSH and PTH levels and iron and calcium levels are pending.  Patient is on hydrochlorothiazide so it can also be a medication side effect.   Problems Addressed: AKI (acute kidney injury) ([place]): acute illness or injury Hypocalcemia: acute illness or injury  Amount and/or Complexity of Data Reviewed Labs: ordered. Decision-making details documented in ED Course. Radiology: ordered and independent interpretation performed. Decision-making details documented in ED Course.  Risk Prescription drug management.    Final Clinical Impression(s) / ED Diagnoses Final diagnoses:  None    Rx / DC Orders ED Discharge Orders     None         Drenda Freeze, MD 07/19/22 [phone]

## 2022-07-19 NOTE — Assessment & Plan Note (Signed)
-  WBC of 3.6. Unclear-questions if he could perhaps have GI malignancy with his anemia, low appetite and intermittent diarrhea.He has hx of prostate cancer but has been off radiation since about 05/2022.  -advise continual workup with GI and PCP outpatient

## 2022-07-19 NOTE — Assessment & Plan Note (Signed)
-  start Tamiflu '75mg'$  BID

## 2022-07-19 NOTE — Assessment & Plan Note (Signed)
-  could be due to his chronic intermittent diarrhea.  -will administer IV Mg

## 2022-07-19 NOTE — Assessment & Plan Note (Signed)
-  replete with IV potassium 40mq x 2

## 2022-07-19 NOTE — Assessment & Plan Note (Signed)
-  creatinine of 2.15. Unknown baseline -will hydrate overnight and follow repeat in the morning

## 2022-07-19 NOTE — ED Provider Triage Note (Signed)
Emergency Medicine Provider Triage Evaluation Note  Victor Garcia , a 72 y.o. male  was evaluated in triage.  Pt complains of generally feeling unwell.  States his symptoms began on Friday when he noticed he began having a cough.  He states that since then he has had no energy and has decreased p.o. intake.  He denies having any objective fever, chest pain, palpitations, shortness of breath, abdominal pain, nausea, vomiting, diarrhea..  Denies sick contacts.  Review of Systems  Positive: See above Negative:   Physical Exam  There were no vitals taken for this visit. Gen:   Awake, no distress   Resp:  Normal effort  MSK:   Moves extremities without difficulty  Other:  Lungs clear, abdomen is soft and non-tender   Medical Decision Making  Medically screening exam initiated at 3:36 PM.  Appropriate orders placed.  Sulaiman Imbert was informed that the remainder of the evaluation will be completed by another provider, this initial triage assessment does not replace that evaluation, and the importance of remaining in the ED until their evaluation is complete.     Mickie Hillier, PA-C 07/19/22 1536

## 2022-07-19 NOTE — Assessment & Plan Note (Signed)
-  non-compliance with CPAP

## 2022-07-19 NOTE — H&P (Signed)
History and Physical    Patient: Victor Garcia XFG:[id number] DOB: 11-Jun-1951 DOA: 07/19/2022 DOS: the patient was seen and examined on 07/19/2022 PCP: Lavone Orn, MD  Patient coming from: Home  Chief Complaint:  Chief Complaint  Patient presents with   Weakness   HPI: Victor Garcia is a 72 y.o. male with medical history significant of stage T2a prostate cancer s/p radiation, HTN, T2DM, OSA who presents with weakness.  About 3 days ago started to have sore throat and a cough. Progressively became more weak. Today could barely walk across his room so wife brought him into ED. Chronically has low appetite and only eats one meal a day. He has been following with [facility] for chronic anemia with scheduled endoscopy on the 25th and was recently diagnosed with C.diff with stool test. Started taking oral vancomycin about 4 days ago. However diarrhea only happens with meals and has been this way for the past year.   In the ED, normotensive on room air.   WBC of 3.6, hgb of 8.1.  Na of 136, K of 3.5, creatinine of 2.15. Ca of 5.6 with albumin of 3. CBG of 177.   Review of Systems: As mentioned in the history of present illness. All other systems reviewed and are negative. Past Medical History:  Diagnosis Date   Diabetes mellitus without complication ([place])    GERD (gastroesophageal reflux disease)    Hypertension    Prostate cancer ([place])    Sleep apnea    cpap   Past Surgical History:  Procedure Laterality Date   COLONOSCOPY WITH PROPOFOL N/A 10/07/2014   Procedure: COLONOSCOPY WITH PROPOFOL;  Surgeon: Garlan Fair, MD;  Location: [facility] ENDOSCOPY;  Service: Endoscopy;  Laterality: N/A;   FOOT FRACTURE SURGERY Right    ORIF   LEG SURGERY Left    Left leg surgery for" fracture near hip bone"   PROSTATE BIOPSY     Social History:  reports that he has never smoked. He has never used smokeless tobacco. He reports current alcohol use. He reports that he does not use drugs.  Allergies   Allergen Reactions   Hydrocodone Itching   Hydrocodone-Acetaminophen Other (See Comments)   Metformin Other (See Comments)   Other Other (See Comments)    Family History  Problem Relation Age of Onset   Prostate cancer Father    Breast cancer Sister    Colon cancer Neg Hx    Pancreatic cancer Neg Hx     Prior to Admission medications   Medication Sig Start Date End Date Taking? Authorizing Provider  amLODipine (NORVASC) 10 MG tablet Take 10 mg by mouth once. 09/22/20   [provider]  carvedilol (COREG) 12.5 MG tablet Take 12.5 mg by mouth daily.  08/26/14   [provider]  gabapentin (NEURONTIN) 300 MG capsule 3 capsules    [provider]  hydrocortisone 2.5 % cream 1 application 03/24/77   [provider]  hydrOXYzine (ATARAX/VISTARIL) 50 MG tablet Take 1 tablet by mouth at bedtime.    [provider]  LOSARTAN POTASSIUM PO Take 1 tablet by mouth daily.    [provider]  pioglitazone (ACTOS) 30 MG tablet Take 1 tablet by mouth daily.    [provider]  PRAVASTATIN SODIUM PO Take by mouth.    [provider]  terbinafine (LAMISIL) 1 % cream 1 application 03/21/80   [provider]  triamcinolone (KENALOG) 0.1 % 1 application 01/75/10   [provider]  Physical Exam: Vitals:   07/19/22 1745 07/19/22 1800 07/19/22 1815 07/19/22 1942  BP: 120/67 121/67 126/65 (!) 120/100  Pulse: 64 63 63 88  Resp: '17 15 15 20  '$ Temp:    98.4 F (36.9 C)  TempSrc:    Oral  SpO2: 100% 100% 99% 100%  Weight:      Height:       Constitutional: NAD, calm, comfortable, thin cachectic appearing elderly male sitting upright in bed Eyes: lids and conjunctivae normal ENMT: Mucous membranes are moist.  Neck: normal, supple Respiratory: clear to auscultation bilaterally, no wheezing, no crackles. Normal respiratory effort. No accessory muscle use.  Cardiovascular: Regular rate and rhythm, no murmurs / rubs /  gallops. No extremity edema. Abdomen: Soft, nontender nondistended, no masses palpated.  Bowel sounds positive.  Musculoskeletal: no clubbing / cyanosis. No joint deformity upper and lower extremities.  Muscle wasting on all extremities  skin: no rashes, lesions, ulcers. No induration Neurologic: CN 2-12 grossly intact.  Strength 5/5 in all 4.  Psychiatric: Normal judgment and insight. Alert and oriented x 3. Normal mood. Data Reviewed:  See HPI  Assessment and Plan: * Hypocalcemia -likely due to hypomagnesia. Will replete with IV calcium and Mg -PTH, TSH pending   Influenza A -start Tamiflu '75mg'$  BID  Diarrhea -Reports intermittent diarrhea worse with food for the past year and was diagnosed with C.diff by [facility] started on oral vancomycin last week. Symptoms appears atypical for C.diff. Will retest here. Continue oral vancomycin for now pending test results  Anemia -Hgb of 8.1, normocytic with no prior for comparisons -reports following [facility] outpatient with planned endoscopy on the 25th to evaluate -also has received Vitamin B12 injections in the past. Will obtain Vitamin P32 and folic levels.   Leukopenia -WBC of 3.6. Unclear-questions if he could perhaps have GI malignancy with his anemia, low appetite and intermittent diarrhea.He has hx of prostate cancer but has been off radiation since about 05/2022.  -advise continual workup with GI and PCP outpatient  AKI (acute kidney injury) ([place]) -creatinine of 2.15. Unknown baseline -will hydrate overnight and follow repeat in the morning  Hypomagnesemia -could be due to his chronic intermittent diarrhea.  -will administer IV Mg  Hypokalemia -replete with IV potassium 12mq x 2  [place] around [place]22. Follows with urology on surveillance.   Obstructive sleep apnea syndrome -non-compliance with CPAP      Advance Care Planning:   Code Status: Full Code   Consults:  none  Family Communication: wife at bedside  Severity of Illness: The appropriate patient status for this patient is OBSERVATION. Observation status is judged to be reasonable and necessary in order to provide the required intensity of service to ensure the patient's safety. The patient's presenting symptoms, physical exam findings, and initial radiographic and laboratory data in the context of their medical condition is felt to place them at decreased risk for further clinical deterioration. Furthermore, it is anticipated that the patient will be medically stable for discharge from the hospital within 2 midnights of admission.   Author: COrene Desanctis DO 07/19/2022 8:46 PM  For on call review [email or web address]

## 2022-07-19 NOTE — Assessment & Plan Note (Signed)
-  s/p radiation around 05/2022. Follows with urology on surveillance.

## 2022-07-19 NOTE — Assessment & Plan Note (Signed)
-  likely due to hypomagnesia. Will replete with IV calcium and Mg -PTH, TSH pending

## 2022-07-19 NOTE — Assessment & Plan Note (Signed)
-  Reports intermittent diarrhea worse with food for the past year and was diagnosed with C.diff by [facility] started on oral vancomycin last week. Symptoms appears atypical for C.diff. Will retest here. Continue oral vancomycin for now pending test results

## 2022-07-19 NOTE — Assessment & Plan Note (Signed)
-  Hgb of 8.1, normocytic with no prior for comparisons -reports following [facility] outpatient with planned endoscopy on the 25th to evaluate -also has received Vitamin B12 injections in the past. Will obtain Vitamin P53 and folic levels.

## 2022-07-19 NOTE — ED Triage Notes (Signed)
Pt presents with c/o weakness for several days. Pt also reports some chills at home but denies any fever.

## 2022-07-20 DIAGNOSIS — K5750 Diverticulosis of both small and large intestine without perforation or abscess without bleeding: Secondary | ICD-10-CM | POA: Diagnosis not present

## 2022-07-20 DIAGNOSIS — D649 Anemia, unspecified: Secondary | ICD-10-CM | POA: Diagnosis not present

## 2022-07-20 DIAGNOSIS — Z888 Allergy status to other drugs, medicaments and biological substances status: Secondary | ICD-10-CM | POA: Diagnosis not present

## 2022-07-20 DIAGNOSIS — J101 Influenza due to other identified influenza virus with other respiratory manifestations: Secondary | ICD-10-CM | POA: Diagnosis present

## 2022-07-20 DIAGNOSIS — Z91199 Patient's noncompliance with other medical treatment and regimen due to unspecified reason: Secondary | ICD-10-CM | POA: Diagnosis not present

## 2022-07-20 DIAGNOSIS — Z79899 Other long term (current) drug therapy: Secondary | ICD-10-CM | POA: Diagnosis not present

## 2022-07-20 DIAGNOSIS — Z6823 Body mass index (BMI) 23.0-23.9, adult: Secondary | ICD-10-CM | POA: Diagnosis not present

## 2022-07-20 DIAGNOSIS — Z1152 Encounter for screening for COVID-19: Secondary | ICD-10-CM | POA: Diagnosis not present

## 2022-07-20 DIAGNOSIS — R64 Cachexia: Secondary | ICD-10-CM | POA: Diagnosis present

## 2022-07-20 DIAGNOSIS — Z885 Allergy status to narcotic agent status: Secondary | ICD-10-CM | POA: Diagnosis not present

## 2022-07-20 DIAGNOSIS — Z8042 Family history of malignant neoplasm of prostate: Secondary | ICD-10-CM | POA: Diagnosis not present

## 2022-07-20 DIAGNOSIS — K640 First degree hemorrhoids: Secondary | ICD-10-CM | POA: Diagnosis not present

## 2022-07-20 DIAGNOSIS — E876 Hypokalemia: Secondary | ICD-10-CM | POA: Diagnosis present

## 2022-07-20 DIAGNOSIS — N179 Acute kidney failure, unspecified: Secondary | ICD-10-CM | POA: Diagnosis present

## 2022-07-20 DIAGNOSIS — Z803 Family history of malignant neoplasm of breast: Secondary | ICD-10-CM | POA: Diagnosis not present

## 2022-07-20 DIAGNOSIS — I10 Essential (primary) hypertension: Secondary | ICD-10-CM | POA: Diagnosis present

## 2022-07-20 DIAGNOSIS — D72819 Decreased white blood cell count, unspecified: Secondary | ICD-10-CM | POA: Diagnosis present

## 2022-07-20 DIAGNOSIS — D62 Acute posthemorrhagic anemia: Secondary | ICD-10-CM | POA: Diagnosis present

## 2022-07-20 DIAGNOSIS — Z923 Personal history of irradiation: Secondary | ICD-10-CM | POA: Diagnosis not present

## 2022-07-20 DIAGNOSIS — E860 Dehydration: Secondary | ICD-10-CM | POA: Diagnosis present

## 2022-07-20 DIAGNOSIS — K921 Melena: Secondary | ICD-10-CM | POA: Diagnosis present

## 2022-07-20 DIAGNOSIS — C61 Malignant neoplasm of prostate: Secondary | ICD-10-CM | POA: Diagnosis present

## 2022-07-20 DIAGNOSIS — K5791 Diverticulosis of intestine, part unspecified, without perforation or abscess with bleeding: Secondary | ICD-10-CM | POA: Diagnosis not present

## 2022-07-20 DIAGNOSIS — G4733 Obstructive sleep apnea (adult) (pediatric): Secondary | ICD-10-CM | POA: Diagnosis present

## 2022-07-20 DIAGNOSIS — K2900 Acute gastritis without bleeding: Secondary | ICD-10-CM | POA: Diagnosis present

## 2022-07-20 DIAGNOSIS — K219 Gastro-esophageal reflux disease without esophagitis: Secondary | ICD-10-CM | POA: Diagnosis present

## 2022-07-20 LAB — BASIC METABOLIC PANEL
Anion gap: 8 (ref 5–15)
BUN: 12 mg/dL (ref 8–23)
CO2: 18 mmol/L — ABNORMAL LOW (ref 22–32)
Calcium: 5.7 mg/dL — CL (ref 8.9–10.3)
Chloride: 113 mmol/L — ABNORMAL HIGH (ref 98–111)
Creatinine, Ser: 1.15 mg/dL (ref 0.61–1.24)
GFR, Estimated: >60 mL/min (ref >60)
Glucose, Bld: 95 mg/dL (ref 70–99)
Potassium: 3.2 mmol/L — ABNORMAL LOW (ref 3.5–5.1)
Sodium: 139 mmol/L (ref 135–145)

## 2022-07-20 LAB — TECHNOLOGIST SMEAR REVIEW: Plt Morphology: NORMAL

## 2022-07-20 LAB — CBC
HCT: 21.6 % — ABNORMAL LOW (ref 39.0–52.0)
Hemoglobin: 7.1 g/dL — ABNORMAL LOW (ref 13.0–17.0)
MCH: 31.8 pg (ref 26.0–34.0)
MCHC: 32.9 g/dL (ref 30.0–36.0)
MCV: 96.9 fL (ref 80.0–100.0)
Platelets: 178 10*3/uL (ref 150–400)
RBC: 2.23 MIL/uL — ABNORMAL LOW (ref 4.22–5.81)
RDW: 13.4 % (ref 11.5–15.5)
WBC: 2.8 10*3/uL — ABNORMAL LOW (ref 4.0–10.5)
nRBC: 0 % (ref 0.0–0.2)

## 2022-07-20 LAB — DIFFERENTIAL
Abs Immature Granulocytes: 0.01 10*3/uL (ref 0.00–0.07)
Basophils Absolute: 0 10*3/uL (ref 0.0–0.1)
Basophils Relative: 0 %
Eosinophils Absolute: 0.2 10*3/uL (ref 0.0–0.5)
Eosinophils Relative: 5 %
Immature Granulocytes: 0 %
Lymphocytes Relative: 28 %
Lymphs Abs: 0.8 10*3/uL (ref 0.7–4.0)
Monocytes Absolute: 0.4 10*3/uL (ref 0.1–1.0)
Monocytes Relative: 13 %
Neutro Abs: 1.6 10*3/uL — ABNORMAL LOW (ref 1.7–7.7)
Neutrophils Relative %: 54 %

## 2022-07-20 LAB — MAGNESIUM: Magnesium: 1.3 mg/dL — ABNORMAL LOW (ref 1.7–2.4)

## 2022-07-20 LAB — FOLATE: Folate: 15.1 ng/mL (ref >5.9)

## 2022-07-20 LAB — CK: Total CK: 105 U/L (ref 49–397)

## 2022-07-20 LAB — VITAMIN D 25 HYDROXY (VIT D DEFICIENCY, FRACTURES): Vit D, 25-Hydroxy: 25.71 ng/mL — ABNORMAL LOW (ref 30–100)

## 2022-07-20 LAB — VITAMIN B12: Vitamin B-12: 1320 pg/mL — ABNORMAL HIGH (ref 180–914)

## 2022-07-20 MED ORDER — GABAPENTIN 300 MG PO CAPS: 900.0000 mg | ORAL_CAPSULE | Freq: Every day | ORAL | Status: DC | PRN

## 2022-07-20 MED ORDER — GUAIFENESIN-DM 100-10 MG/5ML PO SYRP
5.0000 mL | ORAL_SOLUTION | ORAL | Status: DC | PRN
  Administered 2022-07-20 – 2022-07-21 (×3): 5 mL via ORAL
  Filled 2022-07-20 (×3): qty 5

## 2022-07-20 MED ORDER — CALCIUM CARBONATE 1250 (500 CA) MG PO TABS
2.0000 | ORAL_TABLET | Freq: Every day | ORAL | Status: DC
  Administered 2022-07-21 – 2022-07-22 (×2): 2500 mg via ORAL
  Filled 2022-07-20 (×2): qty 2

## 2022-07-20 MED ORDER — ORAL CARE MOUTH RINSE: 15.0000 mL | OROMUCOSAL | Status: DC | PRN

## 2022-07-20 MED ORDER — DORZOLAMIDE HCL-TIMOLOL MAL 2-0.5 % OP SOLN
1.0000 [drp] | Freq: Two times a day (BID) | OPHTHALMIC | Status: DC
  Administered 2022-07-20 – 2022-07-23 (×6): 1 [drp] via OPHTHALMIC
  Filled 2022-07-20: qty 10

## 2022-07-20 MED ORDER — POTASSIUM CHLORIDE CRYS ER 20 MEQ PO TBCR
60.0000 meq | EXTENDED_RELEASE_TABLET | Freq: Once | ORAL | Status: AC
  Administered 2022-07-20: 60 meq via ORAL
  Filled 2022-07-20: qty 3

## 2022-07-20 MED ORDER — MAGNESIUM SULFATE 2 GM/50ML IV SOLN
2.0000 g | Freq: Once | INTRAVENOUS | Status: AC
  Administered 2022-07-20: 2 g via INTRAVENOUS
  Filled 2022-07-20: qty 50

## 2022-07-20 MED ORDER — CARVEDILOL 12.5 MG PO TABS
12.5000 mg | ORAL_TABLET | Freq: Every day | ORAL | Status: DC
  Administered 2022-07-20 – 2022-07-22 (×3): 12.5 mg via ORAL
  Filled 2022-07-20 (×3): qty 1

## 2022-07-20 MED ORDER — AMLODIPINE BESYLATE 10 MG PO TABS
10.0000 mg | ORAL_TABLET | Freq: Every day | ORAL | Status: DC
  Administered 2022-07-20 – 2022-07-22 (×3): 10 mg via ORAL
  Filled 2022-07-20 (×3): qty 1

## 2022-07-20 NOTE — Progress Notes (Addendum)
PROGRESS NOTE    Victor Garcia  TDD:[id number] DOB: 07-16-1951 DOA: 07/19/2022 PCP: Lavone Orn, MD     Brief Narrative:   From admission h and p   Victor Garcia is a 72 y.o. male with medical history significant of stage T2a prostate cancer s/p radiation, HTN, T2DM, OSA who presents with weakness.   About 3 days ago started to have sore throat and a cough. Progressively became more weak. Today could barely walk across his room so wife brought him into ED. Chronically has low appetite and only eats one meal a day. He has been following with [facility] for chronic anemia with scheduled endoscopy on the 25th and was recently diagnosed with C.diff with stool test. Started taking oral vancomycin about 4 days ago. However diarrhea only happens with meals and has been this way for the past year.    In the ED, normotensive on room air.    WBC of 3.6, hgb of 8.1.   Na of 136, K of 3.5, creatinine of 2.15. Ca of 5.6 with albumin of 3. CBG of 177.  Assessment & Plan:   Principal Problem:   Hypocalcemia Active Problems:   Essential hypertension   Obstructive sleep apnea syndrome   Type 2 diabetes mellitus with other specified complication (HCC)   Malignant neoplasm of prostate (HCC)   Hypokalemia   Hypomagnesemia   AKI (acute kidney injury) ([place])   Leukopenia   Anemia   Diarrhea   Influenza A  # Influenza A No hypoxia but likely contributing to malaise - cont tamiflu  # Hypocalcemia Not symptomatic, corrected calcium low in the 6s. Likely 2/2 hypomagnesemia. S/p IV calcium 2 g - f/u pth - f/u vit d - start oral calcium supplement  # Hypomagnesemia Likely 2/2 chronic diarrhea, recently treated for c diff - continue repletion  # Anemia, normocytic B12 and folate wnl. Has egd/colonoscopy scheduled later this month to evaluate. Denies melena/hematochezia. Hgb 7.1 this morning from baseline 8s - continue to monitor, transfuse for hgb less than 7, would involve GI if hgb  continues to down-trend or clinical signs of gi bleeding  # AKI Likely 2/2 dehydration as has resolved with IV fluids - monitor  # Chronic diarrhea Recently treated for c diff, c diff negative here. Denies current diarrhea. Has egd/colonoscopy scheduled  # Leukopenia May be 2/2 acute illness (flu) - f/u smear  # T2DM Glucose appropriate, not on meds at home - monitor daily fasting glucose  # HTN Bp appropriate - resume home coreg and amlodipine. Hold losartan for now given aki  # History prostate cancer Treated with radiation therapy, recently completed  # OSA Not on cpap  # Generalized weakness - pt consult - f/u ck  # Chronic pain - home gabapentin   DVT prophylaxis: lovenox Code Status: full Family Communication: wife updated @ bedside  Level of care: Telemetry Status is: inpt, need for IV therapeutics    Consultants:  none  Procedures: nond  Antimicrobials:  none    Subjective: Feeling well  Objective: Vitals:   07/20/22 0600 07/20/22 0630 07/20/22 0800 07/20/22 0839  BP: 105/76 117/74 121/69 110/72  Pulse: 75 72 73 84  Resp: (!) '22 20 18 10  '$ Temp:    98.8 F (37.1 C)  TempSrc:    Oral  SpO2: 98% 98% 99% 99%  Weight:      Height:        Intake/Output Summary (Last 24 hours) at 07/20/2022 0931 Last data filed at  07/19/2022 2350 Gross per 24 hour  Intake 1350 ml  Output --  Net 1350 ml   Filed Weights   07/19/22 1730  Weight: 74.8 kg    Examination:  General exam: Appears calm and comfortable  Respiratory system: Clear to auscultation. Respiratory effort normal. Cardiovascular system: S1 & S2 heard, RRR. No JVD, murmurs, rubs, gallops or clicks. No pedal edema. Gastrointestinal system: Abdomen is nondistended, soft and nontender. No organomegaly or masses felt. Normal bowel sounds heard. Central nervous system: Alert and oriented. No focal neurological deficits. Extremities: Symmetric 5 x 5 power. Skin: No rashes, lesions or  ulcers Psychiatry: Judgement and insight appear normal. Mood & affect appropriate.     Data Reviewed: I have personally reviewed following labs and imaging studies  CBC: Recent Labs  Lab 07/19/22 1545 07/20/22 0358  WBC 3.6* 2.8*  NEUTROABS 2.2  --   HGB 8.1* 7.1*  HCT 25.5* 21.6*  MCV 97.0 96.9  PLT 221 592   Basic Metabolic Panel: Recent Labs  Lab 07/19/22 1545 07/19/22 1719 07/20/22 0358  NA 136 137 139  K 3.5 3.3* 3.2*  CL 105 106 113*  CO2 18* 19* 18*  GLUCOSE 177* 144* 95  BUN '18 19 12  '$ CREATININE 2.15* 2.11* 1.15  CALCIUM 5.6* 5.8* 5.7*  MG  --  0.9* 1.3*   GFR: Estimated Creatinine Clearance: 59.1 mL/min (by C-G formula based on SCr of 1.15 mg/dL). Liver Function Tests: Recent Labs  Lab 07/19/22 1545  AST 38  ALT 15  ALKPHOS 45  BILITOT 0.4  PROT 6.6  ALBUMIN 3.0*   No results for input(s): "LIPASE", "AMYLASE" in the last 168 hours. No results for input(s): "AMMONIA" in the last 168 hours. Coagulation Profile: No results for input(s): "INR", "PROTIME" in the last 168 hours. Cardiac Enzymes: No results for input(s): "CKTOTAL", "CKMB", "CKMBINDEX", "TROPONINI" in the last 168 hours. BNP (last 3 results) No results for input(s): "PROBNP" in the last 8760 hours. HbA1C: No results for input(s): "HGBA1C" in the last 72 hours. CBG: Recent Labs  Lab 07/19/22 1534  GLUCAP 152*   Lipid Profile: No results for input(s): "CHOL", "HDL", "LDLCALC", "TRIG", "CHOLHDL", "LDLDIRECT" in the last 72 hours. Thyroid Function Tests: Recent Labs    07/19/22 1844  TSH 0.786   Anemia Panel: Recent Labs    07/19/22 2116  VITAMINB12 1,320*  FOLATE 15.1   Urine analysis: No results found for: "COLORURINE", "APPEARANCEUR", "LABSPEC", "PHURINE", "GLUCOSEU", "HGBUR", "BILIRUBINUR", "KETONESUR", "PROTEINUR", "UROBILINOGEN", "NITRITE", "LEUKOCYTESUR" Sepsis Labs: '@LABRCNTIP'$ (procalcitonin:4,lacticidven:4)  ) Recent Results (from the past 240 hour(s))  Resp  panel by RT-PCR (RSV, Flu A&B, Covid) Anterior Nasal Swab     Status: Abnormal   Collection Time: 07/19/22  3:43 PM   Specimen: Anterior Nasal Swab  Result Value Ref Range Status   SARS Coronavirus 2 by RT PCR NEGATIVE NEGATIVE Final    Comment: (NOTE) SARS-CoV-2 target nucleic acids are NOT DETECTED.  The SARS-CoV-2 RNA is generally detectable in upper respiratory specimens during the acute phase of infection. The lowest concentration of SARS-CoV-2 viral copies this assay can detect is 138 copies/mL. A negative result does not preclude SARS-Cov-2 infection and should not be used as the sole basis for treatment or other patient management decisions. A negative result may occur with  improper specimen collection/handling, submission of specimen other than nasopharyngeal swab, presence of viral mutation(s) within the areas targeted by this assay, and inadequate number of viral copies(<138 copies/mL). A negative result must be combined with clinical  observations, patient history, and epidemiological information. The expected result is Negative.  Fact Sheet for Patients:  EntrepreneurPulse.com.au  Fact Sheet for Healthcare Providers:  IncredibleEmployment.be  This test is no t yet approved or cleared by the [place] FDA and  has been authorized for detection and/or diagnosis of SARS-CoV-2 by FDA under an Emergency Use Authorization (EUA). This EUA will remain  in effect (meaning this test can be used) for the duration of the COVID-19 declaration under Section 564(b)(1) of the Act, 21 U.S.C.section 360bbb-3(b)(1), unless the authorization is terminated  or revoked sooner.       Influenza A by PCR POSITIVE (A) NEGATIVE Final   Influenza B by PCR NEGATIVE NEGATIVE Final    Comment: (NOTE) The Xpert Xpress SARS-CoV-2/FLU/RSV plus assay is intended as an aid in the diagnosis of influenza from Nasopharyngeal swab specimens and should not be used  as a sole basis for treatment. Nasal washings and aspirates are unacceptable for Xpert Xpress SARS-CoV-2/FLU/RSV testing.  Fact Sheet for Patients: EntrepreneurPulse.com.au  Fact Sheet for Healthcare Providers: IncredibleEmployment.be  This test is not yet approved or cleared by the [place] FDA and has been authorized for detection and/or diagnosis of SARS-CoV-2 by FDA under an Emergency Use Authorization (EUA). This EUA will remain in effect (meaning this test can be used) for the duration of the COVID-19 declaration under Section 564(b)(1) of the Act, 21 U.S.C. section 360bbb-3(b)(1), unless the authorization is terminated or revoked.     Resp Syncytial Virus by PCR NEGATIVE NEGATIVE Final    Comment: (NOTE) Fact Sheet for Patients: EntrepreneurPulse.com.au  Fact Sheet for Healthcare Providers: IncredibleEmployment.be  This test is not yet approved or cleared by the [place] FDA and has been authorized for detection and/or diagnosis of SARS-CoV-2 by FDA under an Emergency Use Authorization (EUA). This EUA will remain in effect (meaning this test can be used) for the duration of the COVID-19 declaration under Section 564(b)(1) of the Act, 21 U.S.C. section 360bbb-3(b)(1), unless the authorization is terminated or revoked.  Performed at [facility], [place] [address], [place], [place] [address]   C Difficile Quick Screen w PCR reflex     Status: None   Collection Time: 07/19/22  9:39 PM   Specimen: STOOL  Result Value Ref Range Status   C Diff antigen NEGATIVE NEGATIVE Final   C Diff toxin NEGATIVE NEGATIVE Final   C Diff interpretation No C. difficile detected.  Final    Comment: Performed at [facility] [place], [place] [address] [place], [place] [address]         Radiology Studies: [facility] Chest Port 1 View  Result Date: 07/19/2022 CLINICAL DATA:  Cough,  weakness EXAM: PORTABLE CHEST 1 VIEW COMPARISON:  06/22/2018 FINDINGS: Cardiac size is within normal limits. There are no signs of pulmonary edema or focal pulmonary consolidation. Air soft tissue interface is seen in left lung, most likely a skin fold. There is no pleural effusion or pneumothorax. IMPRESSION: No active disease. Electronically Signed   By: Elmer Picker M.D.   On: 07/19/2022 17:19        Scheduled Meds:  oseltamivir  30 mg Oral BID   potassium chloride  60 mEq Oral Once   Continuous Infusions:  magnesium sulfate bolus IVPB       LOS: 0 days     Desma Maxim, MD [facility]   If 7PM-7AM, please contact night-coverage [email or web address] Password [facility] 07/20/2022, 9:31 AM

## 2022-07-21 DIAGNOSIS — D649 Anemia, unspecified: Secondary | ICD-10-CM | POA: Diagnosis not present

## 2022-07-21 DIAGNOSIS — K921 Melena: Secondary | ICD-10-CM | POA: Diagnosis not present

## 2022-07-21 DIAGNOSIS — J101 Influenza due to other identified influenza virus with other respiratory manifestations: Secondary | ICD-10-CM | POA: Diagnosis not present

## 2022-07-21 LAB — CBC
HCT: 21.8 % — ABNORMAL LOW (ref 39.0–52.0)
Hemoglobin: 6.8 g/dL — CL (ref 13.0–17.0)
MCH: 31.5 pg (ref 26.0–34.0)
MCHC: 31.2 g/dL (ref 30.0–36.0)
MCV: 100.9 fL — ABNORMAL HIGH (ref 80.0–100.0)
Platelets: 190 10*3/uL (ref 150–400)
RBC: 2.16 MIL/uL — ABNORMAL LOW (ref 4.22–5.81)
RDW: 13.5 % (ref 11.5–15.5)
WBC: 3.2 10*3/uL — ABNORMAL LOW (ref 4.0–10.5)
nRBC: 0 % (ref 0.0–0.2)

## 2022-07-21 LAB — BASIC METABOLIC PANEL
Anion gap: 8 (ref 5–15)
BUN: 10 mg/dL (ref 8–23)
CO2: 19 mmol/L — ABNORMAL LOW (ref 22–32)
Calcium: 6.5 mg/dL — ABNORMAL LOW (ref 8.9–10.3)
Chloride: 106 mmol/L (ref 98–111)
Creatinine, Ser: 1.13 mg/dL (ref 0.61–1.24)
GFR, Estimated: >60 mL/min (ref >60)
Glucose, Bld: 137 mg/dL — ABNORMAL HIGH (ref 70–99)
Potassium: 3.2 mmol/L — ABNORMAL LOW (ref 3.5–5.1)
Sodium: 133 mmol/L — ABNORMAL LOW (ref 135–145)

## 2022-07-21 LAB — PARATHYROID HORMONE, INTACT (NO CA): PTH: 28 pg/mL (ref 15–65)

## 2022-07-21 LAB — PREPARE RBC (CROSSMATCH)

## 2022-07-21 LAB — MAGNESIUM: Magnesium: 1.7 mg/dL (ref 1.7–2.4)

## 2022-07-21 LAB — CALCIUM, IONIZED: Calcium, Ionized, Serum: 3.2 mg/dL — ABNORMAL LOW (ref 4.5–5.6)

## 2022-07-21 LAB — ABO/RH: ABO/RH(D): O POS

## 2022-07-21 MED ORDER — PEG 3350-KCL-NA BICARB-NACL 420 G PO SOLR
4000.0000 mL | Freq: Once | ORAL | Status: AC
  Administered 2022-07-22: 4000 mL via ORAL

## 2022-07-21 MED ORDER — CALCIUM GLUCONATE-NACL 1-0.675 GM/50ML-% IV SOLN
1.0000 g | Freq: Once | INTRAVENOUS | Status: AC
  Administered 2022-07-21: 1000 mg via INTRAVENOUS
  Filled 2022-07-21: qty 50

## 2022-07-21 MED ORDER — POTASSIUM CHLORIDE CRYS ER 20 MEQ PO TBCR
40.0000 meq | EXTENDED_RELEASE_TABLET | ORAL | Status: AC
  Administered 2022-07-21 (×2): 40 meq via ORAL
  Filled 2022-07-21 (×2): qty 2

## 2022-07-21 MED ORDER — BENZONATATE 100 MG PO CAPS
100.0000 mg | ORAL_CAPSULE | Freq: Once | ORAL | Status: AC
  Administered 2022-07-21: 100 mg via ORAL
  Filled 2022-07-21: qty 1

## 2022-07-21 MED ORDER — MAGNESIUM SULFATE 2 GM/50ML IV SOLN
2.0000 g | Freq: Once | INTRAVENOUS | Status: AC
  Administered 2022-07-21: 2 g via INTRAVENOUS
  Filled 2022-07-21: qty 50

## 2022-07-21 MED ORDER — SODIUM CHLORIDE 0.9 % IV SOLN: INTRAVENOUS | Status: DC

## 2022-07-21 MED ORDER — SODIUM CHLORIDE 0.9% IV SOLUTION: Freq: Once | INTRAVENOUS | Status: DC

## 2022-07-21 MED ORDER — VANCOMYCIN HCL 125 MG PO CAPS: 125.0000 mg | ORAL_CAPSULE | Freq: Four times a day (QID) | ORAL | Status: DC

## 2022-07-21 NOTE — Consult Note (Signed)
Referring Provider: Dr. Sarajane Jews Primary Care Physician:  Lavone Orn, MD Primary Gastroenterologist:  Dr. Paulita Fujita  Reason for Consultation:  GI bleed  HPI: Victor Garcia is a 72 y.o. male with history of C. Diff back in October treated with 2 weeks of PO Vanco and recurrence of C. Diff in mid-Dec and given another 2 week course of Vanco which he did not start until 5 days ago. Diarrhea was ongoing but had lessened in frequency leading up to this admission. He has been having red blood per rectum with every BM for the past month and it was thought that the bleeding was hemorrhoidal when he saw our PA, Neal Dy on 07/06/22. Hgb 6.8 (11.3 on 07/06/22). Has felt weak. Denies abdominal pain/N/V. Diarrhea did improve following the first course of PO Vanco to 2-3 loose stools per day. C. Diff NEG on admit. Last colon 09/2014 (Dr. Wynetta Emery) that showed diffuse diverticulosis and medium internal hemorrhoids.Tested POS for Influenza A on admit. Covid NEG. He is very worried about a stomach ulcer stating his brother died of one in the past. Drinks multiple mixed drinks (Brandy) per day. Denies NSAIDs.  Past Medical History:  Diagnosis Date   Diabetes mellitus without complication ([place])    GERD (gastroesophageal reflux disease)    Hypertension    Prostate cancer ([place])    Sleep apnea    cpap    Past Surgical History:  Procedure Laterality Date   COLONOSCOPY WITH PROPOFOL N/A 10/07/2014   Procedure: COLONOSCOPY WITH PROPOFOL;  Surgeon: Garlan Fair, MD;  Location: [facility] ENDOSCOPY;  Service: Endoscopy;  Laterality: N/A;   FOOT FRACTURE SURGERY Right    ORIF   LEG SURGERY Left    Left leg surgery for" fracture near hip bone"   PROSTATE BIOPSY      Prior to Admission medications   Medication Sig Start Date End Date Taking? Authorizing Provider  amLODipine (NORVASC) 10 MG tablet Take 10 mg by mouth daily. 09/22/20  Yes [provider]  carvedilol (COREG) 12.5 MG tablet Take 12.5 mg by mouth daily.   08/26/14  Yes [provider]  dorzolamide-timolol (COSOPT) 2-0.5 % ophthalmic solution Place 1 drop into both eyes 2 (two) times daily. 06/28/22  Yes [provider]  gabapentin (NEURONTIN) 300 MG capsule Take 900 mg by mouth as needed (nerve pain).   Yes [provider]  hydrocortisone 2.5 % cream Apply 1 Application topically 2 (two) times daily as needed (hemorrhoids). 12/22/19  Yes [provider]  losartan (COZAAR) 100 MG tablet Take 100 mg by mouth daily.   Yes [provider]  pravastatin (PRAVACHOL) 40 MG tablet Take 40 mg by mouth daily.   Yes [provider]  ROCKLATAN 0.02-0.005 % SOLN Place 1 drop into both eyes at bedtime. 07/06/22  Yes [provider]  tamsulosin (FLOMAX) 0.4 MG CAPS capsule Take 0.4 mg by mouth daily. Patient not taking: Reported on 07/20/2022 06/25/22   [provider]    Scheduled Meds:  sodium chloride   Intravenous Once   amLODipine  10 mg Oral Daily   calcium carbonate  2 tablet Oral Q breakfast   carvedilol  12.5 mg Oral Daily   dorzolamide-timolol  1 drop Both Eyes BID   oseltamivir  30 mg Oral BID   Continuous Infusions: PRN Meds:.gabapentin, guaiFENesin-dextromethorphan, mouth rinse  Allergies as of 07/19/2022 - Review Complete 07/19/2022  Allergen Reaction Noted   Hydrocodone Itching 10/07/2014   Hydrocodone-acetaminophen Other (See Comments) 08/14/2020  Metformin Other (See Comments) 08/14/2020   Other Other (See Comments) 08/14/2020    Family History  Problem Relation Age of Onset   Prostate cancer Father    Breast cancer Sister    Colon cancer Neg Hx    Pancreatic cancer Neg Hx     Social History   Socioeconomic History   Marital status: Married    Spouse name: Not on file   Number of children: 3   Years of education: Not on file   Highest education level: Not on file  Occupational History   Not on file  Tobacco Use   Smoking status: Never   Smokeless  tobacco: Never  Vaping Use   Vaping Use: Never used  Substance and Sexual Activity   Alcohol use: Yes    Comment: daily use   Drug use: No   Sexual activity: Yes    Comment: using sildenafil  Other Topics Concern   Not on file  Social History Narrative   Not on file   Social Determinants of Health   Financial Resource Strain: Not on file  Food Insecurity: Not on file  Transportation Needs: Not on file  Physical Activity: Not on file  Stress: Not on file  Social Connections: Not on file  Intimate Partner Violence: Not on file    Review of Systems: All negative except as stated above in HPI.  Physical Exam: Vital signs: Vitals:   07/21/22 0900 07/21/22 1505  BP:  110/70  Pulse:  74  Resp: 16 18  Temp:  98.9 F (37.2 C)  SpO2:  100%   Last BM Date : 07/21/22 General:  Lethargic, Well-developed, well-nourished, pleasant and cooperative in NAD Head: normocephalic, atraumatic Eyes: anicteric sclera ENT: oropharynx clear Neck: supple, nontender Lungs:  Clear throughout to auscultation.   No wheezes, crackles, or rhonchi. No acute distress. Heart:  Regular rate and rhythm; no murmurs, clicks, rubs,  or gallops. Abdomen: soft, nontender, nondistended, +BS  Rectal:  Deferred Ext: no edema  GI:  Lab Results: Recent Labs    07/19/22 1545 07/20/22 0358 07/21/22 0557  WBC 3.6* 2.8* 3.2*  HGB 8.1* 7.1* 6.8*  HCT 25.5* 21.6* 21.8*  PLT 221 178 190   BMET Recent Labs    07/19/22 1719 07/20/22 0358 07/21/22 0557  NA 137 139 133*  K 3.3* 3.2* 3.2*  CL 106 113* 106  CO2 19* 18* 19*  GLUCOSE 144* 95 137*  BUN '19 12 10  '$ CREATININE 2.11* 1.15 1.13  CALCIUM 5.8* 5.7* 6.5*   LFT Recent Labs    07/19/22 1545  PROT 6.6  ALBUMIN 3.0*  AST 38  ALT 15  ALKPHOS 45  BILITOT 0.4   PT/INR No results for input(s): "LABPROT", "INR" in the last 72 hours.   Studies/Results: DG Chest Port 1 View  Result Date: 07/19/2022 CLINICAL DATA:  Cough, weakness EXAM:  PORTABLE CHEST 1 VIEW COMPARISON:  06/22/2018 FINDINGS: Cardiac size is within normal limits. There are no signs of pulmonary edema or focal pulmonary consolidation. Air soft tissue interface is seen in left lung, most likely a skin fold. There is no pleural effusion or pneumothorax. IMPRESSION: No active disease. Electronically Signed   By: Elmer Picker M.D.   On: 07/19/2022 17:19    Impression/Plan: Painless hematochezia with Hgb 6.8 (4.5 g drop in 2 weeks). Suspect diverticular bleed and may need a CT angiogram if bleeding worsens. Doubt peptic ulcer bleed. Recent C. Diff infection last month and October. C. Diff neg  on admit. Colon prep tomorrow. Colon +/- EGD 07/23/21. Supportive care. Clear liquid diet.    LOS: 1 day   Lear Ng  07/21/2022, 4:22 PM  Questions please call [phone]

## 2022-07-21 NOTE — Hospital Course (Addendum)
13yom with PMH including prostate cancer s/p radiation, HTN, T2DM, OSA who presented with weakness.  Admitted for influenza A, hypocalcemia, hypomagnesemia, AKI.  Also chronic rectal bleeding for a month.

## 2022-07-21 NOTE — Progress Notes (Signed)
  Progress Note   Patient: Victor Garcia VCB:[id number] DOB: September 15, 1950 DOA: 07/19/2022     1 DOS: the patient was seen and examined on 07/21/2022   Brief hospital course: 42yom with PMH including prostate cancer s/p radiation, HTN, T2DM, OSA who presented with weakness.  Admitted for influenza A, hypocalcemia, hypomagnesemia, AKI.  Also chronic rectal bleeding for a month.   Assessment and Plan: Influenza A Tamiflu, supportive care.  Painless hematochezia  Anemia, normocytic B12 and folate wnl.  Had EGD/colonoscopy scheduled later this month to evaluate.  Acute on chronic.  Transfuse 1 unit PRBC for hemoglobin 6.8.  Suspect diverticular bleed. If bleeding worsens today then needs a CT angiogram. Plan for colonoscopy plus minus EGD 1/5.   Hypocalcemia Not symptomatic, but significantly low. Ca2+ 6.5 uncorrected, was 5.6 uncorrected on admit (6.0 corrected) > replete. CMP in AM.   Hypomagnesemia, hypokalemia Likely 2/2 chronic diarrhea K+ 3.2 > replete Mg2+ 1.7 > replete   AKI Likely 2/2 dehydration as has resolved with IV fluids Resolved.   Chronic diarrhea Recently treated for c diff, c diff negative here.  Vancomycin note indicated at this time   Leukopenia Suspect secondary to influenza.  Monitor.   Diabetes mellitus type 2, diet controlled Follow-up as an outpatient.   Essential hypertension. Continue carvedilol and amlodipine.  Can resume losartan on discharge.   History prostate cancer Treated with radiation therapy, recently completed   OSA Not on cpap   Generalized weakness Outpatient PT  Chronic pain - home gabapentin      Subjective:  Hgb 6.8 per RN no bleeding, vitals ok Feels ok today, still weak but better. Still having blood with bowel movement, reports a lot of blood yesterday, little bit today Has been bleeding for a month  Physical Exam: Vitals:   07/21/22 1704 07/21/22 1719 07/21/22 1800 07/21/22 1911  BP:  107/80  131/83  Pulse:  81   74  Resp: (!) '27 17 16 13  '$ Temp:  98.8 F (37.1 C)  98.1 F (36.7 C)  TempSrc:  Oral  Oral  SpO2:  100%    Weight:      Height:       Physical Exam Vitals reviewed.  Constitutional:      General: He is not in acute distress.    Appearance: He is not toxic-appearing.  Cardiovascular:     Rate and Rhythm: Normal rate and regular rhythm.     Heart sounds: No murmur heard. Pulmonary:     Effort: Pulmonary effort is normal. No respiratory distress.     Breath sounds: No wheezing, rhonchi or rales.  Abdominal:     General: Abdomen is flat.     Palpations: Abdomen is soft.  Neurological:     Mental Status: He is alert.  Psychiatric:        Behavior: Behavior normal.     Data Reviewed: K+ 3.2 Ca2+ 6.5 uncorrected, was 5.6 uncorrected on admit (6.0 corrected) Mg2+ 1.7 Hgb 6.8  Family Communication: wife at bedside  Disposition: Status is: Inpatient Remains inpatient appropriate because: GIB, flu  Planned Discharge Destination: Home    Time spent: 35 minutes  Author: Murray Hodgkins, MD 07/21/2022 8:02 PM  For on call review [email or web address].

## 2022-07-21 NOTE — Progress Notes (Signed)
Critical lab HGB 6.8. Sarajane Jews MD notified via page. Awaiting orders.

## 2022-07-21 NOTE — Evaluation (Signed)
Physical Therapy Evaluation Patient Details Name: Victor Garcia MRN: [id number] DOB: 1950-09-07 Today's Date: 07/21/2022  History of Present Illness  Patient is 72 y.o. male who presents with weakness. PMH significant of stage T2a prostate cancer s/p radiation, HTN, T2DM, OSA.    Clinical Impression  Victor Garcia is 72 y.o. male admitted with above HPI and diagnosis. Patient is currently limited by functional impairments below (see PT problem list). Patient lives with family and is independent at baseline. Overall he requires supervision for gait and is mod independent with transfers. Patient evaluated by Physical Therapy with no further acute PT needs identified. All education has been completed and the patient has no further questions. Recommend OPPT follow up for balance and generalized strengthening. See below for any follow-up Physical Therapy or equipment needs. PT is signing off. Thank you for this referral.      Recommendations for follow up therapy are one component of a multi-disciplinary discharge planning process, led by the attending physician.  Recommendations may be updated based on patient status, additional functional criteria and insurance authorization.  Follow Up Recommendations Outpatient PT      Assistance Recommended at Discharge Intermittent Supervision/Assistance  Patient can return home with the following  A little help with walking and/or transfers;A little help with bathing/dressing/bathroom;Assistance with cooking/housework;Assist for transportation;Help with stairs or ramp for entrance    Equipment Recommendations None recommended by PT  Recommendations for Other Services       Functional Status Assessment Patient has had a recent decline in their functional status and demonstrates the ability to make significant improvements in function in a reasonable and predictable amount of time.     Precautions / Restrictions Precautions Precautions:  Fall Restrictions Weight Bearing Restrictions: No      Mobility  Bed Mobility               General bed mobility comments: pt OOB to recliner    Transfers Overall transfer level: Needs assistance Equipment used: None Transfers: Sit to/from Stand Sit to Stand: Modified independent (Device/Increase time)           General transfer comment: mod I with extra time, use of UE for rise and lower. able to complete with no UE use.    Ambulation/Gait Ambulation/Gait assistance: Supervision, Min guard Gait Distance (Feet): 250 Feet Assistive device: None Gait Pattern/deviations: Step-through pattern, Decreased stride length, Drifts right/left, Decreased dorsiflexion - right Gait velocity: decr     General Gait Details: guard/sup for safety, pt drifting Rt slightly during gait and has Rt foot slap with heel to toe pattern. strength testing did  not reveal significant difference.  Stairs            Wheelchair Mobility    Modified Rankin (Stroke Patients Only)       Balance Overall balance assessment: Mild deficits observed, not formally tested, Needs assistance Sitting-balance support: Feet supported Sitting balance-Leahy Scale: Good     Standing balance support: During functional activity, No upper extremity supported Standing balance-Leahy Scale: Fair   Single Leg Stance - Right Leg: 0 Single Leg Stance - Left Leg: 0 Tandem Stance - Right Leg:  (unable to obtain position) Tandem Stance - Left Leg:  (unable to obtain position)                     Pertinent Vitals/Pain Pain Assessment Pain Assessment: No/denies pain    Home Living Family/patient expects to be discharged to:: Private residence Living Arrangements: Spouse/significant other;Children (son  and daughter back home) Available Help at Discharge: Family Type of Home: House Home Access: Stairs to enter Entrance Stairs-Rails: None Entrance Stairs-Number of Steps: 2 Alternate Level  Stairs-Number of Steps: 14 Home Layout: Two level;Bed/bath upstairs Home Equipment: None      Prior Function Prior Level of Function : Independent/Modified Independent                     Hand Dominance   Dominant Hand: Right    Extremity/Trunk Assessment   Upper Extremity Assessment Upper Extremity Assessment: Overall WFL for tasks assessed    Lower Extremity Assessment Lower Extremity Assessment: Overall WFL for tasks assessed (5x Sit<>Stand: 18 seconds; no UE use)    Cervical / Trunk Assessment Cervical / Trunk Assessment: Normal  Communication   Communication: No difficulties  Cognition Arousal/Alertness: Awake/alert Behavior During Therapy: WFL for tasks assessed/performed Overall Cognitive Status: Within Functional Limits for tasks assessed                                          General Comments      Exercises     Assessment/Plan    PT Assessment Patient does not need any further PT services  PT Problem List         PT Treatment Interventions      PT Goals (Current goals can be found in the Care Plan section)  Acute Rehab PT Goals Patient Stated Goal: regain strength PT Goal Formulation: All assessment and education complete, DC therapy Time For Goal Achievement: 08/04/22 Potential to Achieve Goals: Good    Frequency       Co-evaluation               AM-PAC PT "6 Clicks" Mobility  Outcome Measure Help needed turning from your back to your side while in a flat bed without using bedrails?: None Help needed moving from lying on your back to sitting on the side of a flat bed without using bedrails?: None Help needed moving to and from a bed to a chair (including a wheelchair)?: None Help needed standing up from a chair using your arms (e.g., wheelchair or bedside chair)?: None Help needed to walk in hospital room?: A Little Help needed climbing 3-5 steps with a railing? : A Little 6 Click Score: 22    End of  Session Equipment Utilized During Treatment: Gait belt Activity Tolerance: Patient tolerated treatment well Patient left: in chair;with call bell/phone within reach;with family/visitor present Nurse Communication: Mobility status PT Visit Diagnosis: Unsteadiness on feet (R26.81);Other abnormalities of gait and mobility (R26.89);Difficulty in walking, not elsewhere classified (R26.2)    Time: 4431-5400 PT Time Calculation (min) (ACUTE ONLY): 23 min   Charges:   PT Evaluation $PT Eval Low Complexity: 1 Low PT Treatments $Gait Training: 8-22 mins        Verner Mould, DPT Acute Rehabilitation Services Office [phone]  07/21/22 4:07 PM

## 2022-07-21 NOTE — Progress Notes (Signed)
Notified Blood bank in regards to type and screen still pending. Blood bank discussed that lab still processing-will call when blood is ready.

## 2022-07-21 NOTE — Progress Notes (Signed)
Lab reported critical hemoglobin result of 6.8. Primary nurse [facility] notified.

## 2022-07-22 DIAGNOSIS — D649 Anemia, unspecified: Secondary | ICD-10-CM | POA: Diagnosis not present

## 2022-07-22 DIAGNOSIS — K921 Melena: Secondary | ICD-10-CM | POA: Diagnosis not present

## 2022-07-22 LAB — COMPREHENSIVE METABOLIC PANEL
ALT: 15 U/L (ref 0–44)
AST: 26 U/L (ref 15–41)
Albumin: 2.9 g/dL — ABNORMAL LOW (ref 3.5–5.0)
Alkaline Phosphatase: 39 U/L (ref 38–126)
Anion gap: 4 — ABNORMAL LOW (ref 5–15)
BUN: 7 mg/dL — ABNORMAL LOW (ref 8–23)
CO2: 20 mmol/L — ABNORMAL LOW (ref 22–32)
Calcium: 7.8 mg/dL — ABNORMAL LOW (ref 8.9–10.3)
Chloride: 112 mmol/L — ABNORMAL HIGH (ref 98–111)
Creatinine, Ser: 0.97 mg/dL (ref 0.61–1.24)
GFR, Estimated: >60 mL/min (ref >60)
Glucose, Bld: 99 mg/dL (ref 70–99)
Potassium: 4.6 mmol/L (ref 3.5–5.1)
Sodium: 136 mmol/L (ref 135–145)
Total Bilirubin: 0.5 mg/dL (ref 0.3–1.2)
Total Protein: 5.9 g/dL — ABNORMAL LOW (ref 6.5–8.1)

## 2022-07-22 LAB — CBC
HCT: 28.6 % — ABNORMAL LOW (ref 39.0–52.0)
Hemoglobin: 9 g/dL — ABNORMAL LOW (ref 13.0–17.0)
MCH: 30.7 pg (ref 26.0–34.0)
MCHC: 31.5 g/dL (ref 30.0–36.0)
MCV: 97.6 fL (ref 80.0–100.0)
Platelets: 191 10*3/uL (ref 150–400)
RBC: 2.93 MIL/uL — ABNORMAL LOW (ref 4.22–5.81)
RDW: 14.3 % (ref 11.5–15.5)
WBC: 2.5 10*3/uL — ABNORMAL LOW (ref 4.0–10.5)
nRBC: 0 % (ref 0.0–0.2)

## 2022-07-22 LAB — TYPE AND SCREEN
ABO/RH(D): O POS
Antibody Screen: NEGATIVE
Unit division: 0

## 2022-07-22 LAB — BPAM RBC
Blood Product Expiration Date: 202401312359
ISSUE DATE / TIME: 202401031649
Unit Type and Rh: 5100

## 2022-07-22 LAB — MAGNESIUM: Magnesium: 2 mg/dL (ref 1.7–2.4)

## 2022-07-22 NOTE — Progress Notes (Signed)
  Transition of Care (TOC) Screening Note   Patient Details  Name: [name] Date of Birth: 06-24-1951   Transition of Care [facility]) CM/SW Contact:    Vassie Moselle, LCSW Phone Number: 07/22/2022, 9:00 AM    Transition of Care Department [facility]) has reviewed patient and no TOC needs have been identified at this time. We will continue to monitor patient advancement through interdisciplinary progression rounds. If new patient transition needs arise, please place a TOC consult.

## 2022-07-22 NOTE — Progress Notes (Signed)
  Progress Note   Patient: Victor Garcia YIR:[id number] DOB: [birth year] DOA: 07/19/2022     2 DOS: the patient was seen and examined on 07/22/2022   Brief hospital course: 29yom with PMH including prostate cancer s/p radiation, HTN, T2DM, OSA who presented with weakness.  Admitted for influenza A, hypocalcemia, hypomagnesemia, AKI.  Also chronic rectal bleeding for a month.   Assessment and Plan: Influenza A Tamiflu, supportive care. Appears resolved.   Painless hematochezia  Anemia, normocytic B12 and folate wnl.  Had EGD/colonoscopy scheduled later this month to evaluate.  Acute on chronic.  Transfused 1 unit PRBC for hemoglobin 6.8, now up to 9.0.  Suspect diverticular bleed. If bleeding worsens then needs a CT angiogram. Plan for colonoscopy and EGD 1/5.   Hypocalcemia Not symptomatic, but significantly low on admission (Ca2+ 6.0 corrected)  8.3 corrected today    Hypomagnesemia, hypokalemia Likely 2/2 chronic diarrhea K+ and Mg2+ now WNL   AKI Likely 2/2 dehydration as has resolved with IV fluids Resolved.   Chronic diarrhea Recently treated for c diff, c diff negative here.  Vancomycin not indicated at this time   Leukopenia Suspect secondary to influenza.  Monitor.   Diabetes mellitus type 2, diet-controlled Follow-up as an outpatient.   Essential hypertension. Continue carvedilol and amlodipine.  Can resume losartan on discharge.   History prostate cancer Treated with radiation therapy, recently completed   OSA Not on cpap   Generalized weakness Outpatient PT   Chronic pain - home gabapentin      Subjective:  Feels better Still having some blood with bowel movement  Physical Exam: Vitals:   07/21/22 1719 07/21/22 1800 07/21/22 1911 07/22/22 0441  BP: 107/80  131/83 128/76  Pulse: 81  74 63  Resp: '17 16 13 16  '$ Temp: 98.8 F (37.1 C)  98.1 F (36.7 C) 97.9 F (36.6 C)  TempSrc: Oral  Oral Oral  SpO2: 100%   100%  Weight:      Height:        Physical Exam Vitals reviewed.  Constitutional:      General: He is not in acute distress.    Appearance: He is not ill-appearing or toxic-appearing.  Cardiovascular:     Rate and Rhythm: Normal rate and regular rhythm.     Heart sounds: No murmur heard. Pulmonary:     Effort: Pulmonary effort is normal. No respiratory distress.     Breath sounds: No wheezing, rhonchi or rales.  Neurological:     Mental Status: He is alert.  Psychiatric:        Mood and Affect: Mood normal.        Behavior: Behavior normal.     Data Reviewed: CMP noted Hgb up to 9.0 s/p 1 unit PRBC WBC fluctuating, down to 2.5 today  Family Communication: wife at bedside  Disposition: Status is: Inpatient Remains inpatient appropriate because: GIB  Planned Discharge Destination: Home    Time spent: 20 minutes  Author: Murray Hodgkins, MD 07/22/2022 8:45 AM  For on call review [email or web address].

## 2022-07-22 NOTE — Progress Notes (Signed)
[facility] Progress Note  Victor Garcia 72 y.o. [birth year]   Subjective: Reports recurrent red blood per rectum this morning. Denies abdominal pain. Wife in room.  Objective: Vital signs: Vitals:   07/21/22 1911 07/22/22 0441  BP: 131/83 128/76  Pulse: 74 63  Resp: 13 16  Temp: 98.1 F (36.7 C) 97.9 F (36.6 C)  SpO2:  100%    Physical Exam: Gen: lethargic, no acute distress, well-nourished, pleasant HEENT: anicteric sclera, poor dentition CV: RRR Chest: CTA B Abd: soft, nontender, nondistended, +BS Ext: no edema  Lab Results: Recent Labs    07/21/22 0557 07/22/22 0519  NA 133* 136  K 3.2* 4.6  CL 106 112*  CO2 19* 20*  GLUCOSE 137* 99  BUN 10 7*  CREATININE 1.13 0.97  CALCIUM 6.5* 7.8*  MG 1.7 2.0   Recent Labs    07/19/22 1545 07/22/22 0519  AST 38 26  ALT 15 15  ALKPHOS 45 39  BILITOT 0.4 0.5  PROT 6.6 5.9*  ALBUMIN 3.0* 2.9*   Recent Labs    07/19/22 1545 07/20/22 0358 07/21/22 0557 07/22/22 0519  WBC 3.6* 2.8* 3.2* 2.5*  NEUTROABS 2.2 1.6*  --   --   HGB 8.1* 7.1* 6.8* 9.0*  HCT 25.5* 21.6* 21.8* 28.6*  MCV 97.0 96.9 100.9* 97.6  PLT 221 178 190 191      Assessment/Plan: Painless hematochezia - likely diverticular. Colon prep today. Clear liquid diet. NPO p MN. EGD/colon tomorrow.   Lear Ng 07/22/2022, 12:55 PM  Questions please call [phone] ID: Victor Garcia, male   DOB: [birth year], 72 y.o.   MRN: [id number]

## 2022-07-22 NOTE — H&P (View-Only) (Signed)
Victor Garcia Progress Note  Victor Garcia 72 y.o. [birth year]   Subjective: Reports recurrent red blood per rectum this morning. Denies abdominal pain. Wife in room.  Objective: Vital signs: Vitals:   07/21/22 1911 07/22/22 0441  BP: 131/83 128/76  Pulse: 74 63  Resp: 13 16  Temp: 98.1 F (36.7 C) 97.9 F (36.6 C)  SpO2:  100%    Physical Exam: Gen: lethargic, no acute distress, well-nourished, pleasant HEENT: anicteric sclera, poor dentition CV: RRR Chest: CTA B Abd: soft, nontender, nondistended, +BS Ext: no edema  Lab Results: Recent Labs    07/21/22 0557 07/22/22 0519  NA 133* 136  K 3.2* 4.6  CL 106 112*  CO2 19* 20*  GLUCOSE 137* 99  BUN 10 7*  CREATININE 1.13 0.97  CALCIUM 6.5* 7.8*  MG 1.7 2.0   Recent Labs    07/19/22 1545 07/22/22 0519  AST 38 26  ALT 15 15  ALKPHOS 45 39  BILITOT 0.4 0.5  PROT 6.6 5.9*  ALBUMIN 3.0* 2.9*   Recent Labs    07/19/22 1545 07/20/22 0358 07/21/22 0557 07/22/22 0519  WBC 3.6* 2.8* 3.2* 2.5*  NEUTROABS 2.2 1.6*  --   --   HGB 8.1* 7.1* 6.8* 9.0*  HCT 25.5* 21.6* 21.8* 28.6*  MCV 97.0 96.9 100.9* 97.6  PLT 221 178 190 191      Assessment/Plan: Painless hematochezia - likely diverticular. Colon prep today. Clear liquid diet. NPO p MN. EGD/colon tomorrow.   Victor Garcia 07/22/2022, 12:55 PM  Questions please call [phone] ID: Victor Garcia, male   DOB: 1950/08/09, 72 y.o.   MRN: [id number]

## 2022-07-23 ENCOUNTER — Encounter (HOSPITAL_COMMUNITY): Payer: Self-pay | Admitting: Obstetrics and Gynecology

## 2022-07-23 ENCOUNTER — Inpatient Hospital Stay (HOSPITAL_COMMUNITY): Payer: Medicare PPO | Admitting: Certified Registered Nurse Anesthetist

## 2022-07-23 ENCOUNTER — Encounter (HOSPITAL_COMMUNITY)
Admission: EM | Disposition: A | Discharge status: Home / Self Care | Payer: Self-pay | Source: Home / Self Care | Attending: Family Medicine

## 2022-07-23 DIAGNOSIS — J101 Influenza due to other identified influenza virus with other respiratory manifestations: Secondary | ICD-10-CM | POA: Diagnosis not present

## 2022-07-23 DIAGNOSIS — D62 Acute posthemorrhagic anemia: Secondary | ICD-10-CM

## 2022-07-23 DIAGNOSIS — K5791 Diverticulosis of intestine, part unspecified, without perforation or abscess with bleeding: Secondary | ICD-10-CM

## 2022-07-23 DIAGNOSIS — K922 Gastrointestinal hemorrhage, unspecified: Secondary | ICD-10-CM | POA: Diagnosis present

## 2022-07-23 DIAGNOSIS — K5750 Diverticulosis of both small and large intestine without perforation or abscess without bleeding: Secondary | ICD-10-CM

## 2022-07-23 DIAGNOSIS — I10 Essential (primary) hypertension: Secondary | ICD-10-CM

## 2022-07-23 DIAGNOSIS — K2900 Acute gastritis without bleeding: Secondary | ICD-10-CM

## 2022-07-23 DIAGNOSIS — K640 First degree hemorrhoids: Secondary | ICD-10-CM

## 2022-07-23 DIAGNOSIS — K6389 Other specified diseases of intestine: Secondary | ICD-10-CM

## 2022-07-23 DIAGNOSIS — N179 Acute kidney failure, unspecified: Secondary | ICD-10-CM | POA: Diagnosis not present

## 2022-07-23 HISTORY — PX: ESOPHAGOGASTRODUODENOSCOPY: SHX5428

## 2022-07-23 HISTORY — PX: COLONOSCOPY: SHX5424

## 2022-07-23 LAB — COMPREHENSIVE METABOLIC PANEL
ALT: 13 U/L (ref 0–44)
AST: 20 U/L (ref 15–41)
Albumin: 2.9 g/dL — ABNORMAL LOW (ref 3.5–5.0)
Alkaline Phosphatase: 35 U/L — ABNORMAL LOW (ref 38–126)
Anion gap: 7 (ref 5–15)
BUN: 7 mg/dL — ABNORMAL LOW (ref 8–23)
CO2: 20 mmol/L — ABNORMAL LOW (ref 22–32)
Calcium: 7.8 mg/dL — ABNORMAL LOW (ref 8.9–10.3)
Chloride: 108 mmol/L (ref 98–111)
Creatinine, Ser: 1.01 mg/dL (ref 0.61–1.24)
GFR, Estimated: >60 mL/min (ref >60)
Glucose, Bld: 94 mg/dL (ref 70–99)
Potassium: 4 mmol/L (ref 3.5–5.1)
Sodium: 135 mmol/L (ref 135–145)
Total Bilirubin: 0.4 mg/dL (ref 0.3–1.2)
Total Protein: 5.7 g/dL — ABNORMAL LOW (ref 6.5–8.1)

## 2022-07-23 LAB — CBC
HCT: 27.4 % — ABNORMAL LOW (ref 39.0–52.0)
Hemoglobin: 8.7 g/dL — ABNORMAL LOW (ref 13.0–17.0)
MCH: 30.6 pg (ref 26.0–34.0)
MCHC: 31.8 g/dL (ref 30.0–36.0)
MCV: 96.5 fL (ref 80.0–100.0)
Platelets: 202 10*3/uL (ref 150–400)
RBC: 2.84 MIL/uL — ABNORMAL LOW (ref 4.22–5.81)
RDW: 14 % (ref 11.5–15.5)
WBC: 3 10*3/uL — ABNORMAL LOW (ref 4.0–10.5)
nRBC: 0 % (ref 0.0–0.2)

## 2022-07-23 LAB — MAGNESIUM: Magnesium: 1.7 mg/dL (ref 1.7–2.4)

## 2022-07-23 SURGERY — EGD (ESOPHAGOGASTRODUODENOSCOPY)
Anesthesia: Monitor Anesthesia Care

## 2022-07-23 MED ORDER — MAGNESIUM OXIDE 400 MG PO CAPS: 400.0000 mg | ORAL_CAPSULE | Freq: Every day | ORAL | 0 refills | Status: DC

## 2022-07-23 MED ORDER — ONE-A-DAY MENS PO TABS: 1.0000 | ORAL_TABLET | Freq: Every day | ORAL | 2 refills | Status: AC

## 2022-07-23 MED ORDER — LACTATED RINGERS IV SOLN
INTRAVENOUS | Status: DC
  Administered 2022-07-23: 1000 mL via INTRAVENOUS

## 2022-07-23 MED ORDER — ONE-A-DAY MENS PO TABS: 1.0000 | ORAL_TABLET | Freq: Every day | ORAL | 2 refills | Status: DC

## 2022-07-23 MED ORDER — MAGNESIUM SULFATE 2 GM/50ML IV SOLN
2.0000 g | Freq: Once | INTRAVENOUS | Status: AC
  Administered 2022-07-23: 2 g via INTRAVENOUS
  Filled 2022-07-23: qty 50

## 2022-07-23 MED ORDER — PROPOFOL 10 MG/ML IV BOLUS
INTRAVENOUS | Status: AC
  Filled 2022-07-23: qty 20

## 2022-07-23 MED ORDER — OSELTAMIVIR PHOSPHATE 30 MG PO CAPS: 30.0000 mg | ORAL_CAPSULE | Freq: Two times a day (BID) | ORAL | 0 refills | Status: AC

## 2022-07-23 MED ORDER — CALCIUM CARBONATE 1250 (500 CA) MG PO TABS: 1.0000 | ORAL_TABLET | Freq: Every day | ORAL | 0 refills | Status: DC

## 2022-07-23 MED ORDER — PROPOFOL 500 MG/50ML IV EMUL
INTRAVENOUS | Status: DC | PRN
  Administered 2022-07-23: 125 ug/kg/min via INTRAVENOUS

## 2022-07-23 MED ORDER — PROPOFOL 1000 MG/100ML IV EMUL
INTRAVENOUS | Status: AC
  Filled 2022-07-23: qty 100

## 2022-07-23 MED ORDER — ONDANSETRON HCL 4 MG/2ML IJ SOLN
INTRAMUSCULAR | Status: DC | PRN
  Administered 2022-07-23: 4 mg via INTRAVENOUS

## 2022-07-23 NOTE — Op Note (Signed)
[facility] Patient Name: Victor Garcia Procedure Date: 07/23/2022 MRN: [id number] Attending MD: Lear Ng , MD, [id number] Date of Birth: [birth year] CSN: [id number] Age: 72 Admit Type: Inpatient Procedure:                Colonoscopy Indications:              Last colonoscopy: March 2016, Hematochezia, Acute                            post hemorrhagic anemia Providers:                Lear Ng, MD, Dulcy Fanny, Frazier Richards, Technician Referring MD:             hospital team Medicines:                Propofol per Anesthesia, Monitored Anesthesia Care Complications:            No immediate complications. Estimated Blood Loss:     Estimated blood loss: none. Procedure:                Pre-Anesthesia Assessment:                           - Prior to the procedure, a History and Physical                            was performed, and patient medications and                            allergies were reviewed. The patient's tolerance of                            previous anesthesia was also reviewed. The risks                            and benefits of the procedure and the sedation                            options and risks were discussed with the patient.                            All questions were answered, and informed consent                            was obtained. Prior Anticoagulants: The patient has                            taken no anticoagulant or antiplatelet agents. ASA                            Grade Assessment: II - A patient with mild systemic  disease. After reviewing the risks and benefits,                            the patient was deemed in satisfactory condition to                            undergo the procedure.                           After obtaining informed consent, the colonoscope                            was passed under direct vision. Throughout the                             procedure, the patient's blood pressure, pulse, and                            oxygen saturations were monitored continuously. The                            [id number]-HQ190L ([id number]) Olympus colonoscope was                            introduced through the anus and advanced to the the                            cecum, identified by appendiceal orifice and                            ileocecal valve. The colonoscopy was performed                            without difficulty. The patient tolerated the                            procedure well. The quality of the bowel                            preparation was adequate and good. The terminal                            ileum, ileocecal valve, appendiceal orifice, and                            rectum were photographed. Scope In: 12:51:05 PM Scope Out: 1:00:07 PM Scope Withdrawal Time: 0 hours 6 minutes 37 seconds  Total Procedure Duration: 0 hours 9 minutes 2 seconds  Findings:      The perianal and digital rectal examinations were normal.      Multiple large-mouthed, medium-mouthed and small-mouthed diverticula       were found in the entire colon.      A segmental area of mildly petechial mucosa was found in the cecum       (suspect barotrauma).      Internal  hemorrhoids were found during retroflexion. The hemorrhoids       were medium-sized and Grade I (internal hemorrhoids that do not       prolapse).      The terminal ileum appeared normal. Impression:               - Diverticulosis in the entire examined colon.                           - Petechial mucosa in the cecum.                           - Internal hemorrhoids.                           - The examined portion of the ileum was normal.                           - No specimens collected. Moderate Sedation:      N/A - MAC procedure Recommendation:           - Diabetic (ADA) diet.                           - Repeat colonoscopy is not recommended due to                             current age (37 years or older) for screening                            purposes.                           - Continue present medications. Procedure Code(s):        --- Professional ---                           [phone], Colonoscopy, flexible; diagnostic, including                            collection of specimen(s) by brushing or washing,                            when performed (separate procedure) Diagnosis Code(s):        --- Professional ---                           D62, Acute posthemorrhagic anemia                           K92.1, Melena (includes Hematochezia)                           K64.0, First degree hemorrhoids                           K63.89, Other specified diseases of intestine  K57.30, Diverticulosis of large intestine without                            perforation or abscess without bleeding CPT copyright 2022 American [employer] Association. All rights reserved. The codes documented in this report are preliminary and upon coder review may  be revised to meet current compliance requirements. Lear Ng, MD 07/23/2022 1:15:43 PM This report has been signed electronically. Number of Addenda: 0

## 2022-07-23 NOTE — Interval H&P Note (Signed)
History and Physical Interval Note:  07/23/2022 12:32 PM  Victor Garcia  has presented today for surgery, with the diagnosis of gi bleed.  The various methods of treatment have been discussed with the patient and family. After consideration of risks, benefits and other options for treatment, the patient has consented to  Procedure(s): ESOPHAGOGASTRODUODENOSCOPY (EGD) (N/A) COLONOSCOPY (N/A) as a surgical intervention.  The patient's history has been reviewed, patient examined, no change in status, stable for surgery.  I have reviewed the patient's chart and labs.  Questions were answered to the patient's satisfaction.     Lear Ng

## 2022-07-23 NOTE — Care Management Important Message (Signed)
Important Message  Patient Details IM Letter given. Name: Victor Garcia MRN: [id number] Date of Birth: [birth year]   Medicare Important Message Given:  Yes     Kerin Salen 07/23/2022, 10:31 AM

## 2022-07-23 NOTE — Anesthesia Preprocedure Evaluation (Addendum)
Anesthesia Evaluation  Patient identified by MRN, date of birth, ID band Patient awake    Reviewed: Allergy & Precautions, NPO status , Patient's Chart, lab work & pertinent test results, reviewed documented beta blocker date and time   Airway Mallampati: III  TM Distance: >3 FB Neck ROM: Full    Dental  (+) Teeth Intact, Dental Advisory Given   Pulmonary sleep apnea and Continuous Positive Airway Pressure Ventilation  FLU +   Pulmonary exam normal breath sounds clear to auscultation       Cardiovascular hypertension, Pt. on home beta blockers and Pt. on medications Normal cardiovascular exam Rhythm:Regular Rate:Normal     Neuro/Psych negative neurological ROS  negative psych ROS   GI/Hepatic Neg liver ROS,GERD  ,,gi bleed   Endo/Other  diabetes    Renal/GU negative Renal ROS   Prostate cancer     Musculoskeletal  (+) Arthritis ,    Abdominal   Peds  Hematology  (+) Blood dyscrasia, anemia   Anesthesia Other Findings Day of surgery medications reviewed with the patient.  Reproductive/Obstetrics                             Anesthesia Physical Anesthesia Plan  ASA: 2  Anesthesia Plan: MAC   Post-op Pain Management: Minimal or no pain anticipated   Induction: Intravenous  PONV Risk Score and Plan: 1 and TIVA and Treatment may vary due to age or medical condition  Airway Management Planned: Natural Airway and Simple Face Mask  Additional Equipment:   Intra-op Plan:   Post-operative Plan:   Informed Consent: I have reviewed the patients History and Physical, chart, labs and discussed the procedure including the risks, benefits and alternatives for the proposed anesthesia with the patient or authorized representative who has indicated his/her understanding and acceptance.     Dental advisory given  Plan Discussed with: CRNA and Anesthesiologist  Anesthesia Plan Comments:          Anesthesia Quick Evaluation

## 2022-07-23 NOTE — Discharge Summary (Signed)
Physician Discharge Summary   Patient: Victor Garcia MRN: [id number] DOB: 1950-10-09  Admit date:     07/19/2022  Discharge date: 07/23/22  Discharge Physician: Murray Hodgkins   PCP: Lavone Orn, MD   Recommendations at discharge:    Painless hematochezia  Anemia, normocytic If bleeding recurs, consider a CT angiogram.   Hypocalcemia Continue oral repletion for 1 month and suggest repeat CMP.   Hypomagnesemia, hypokalemia Will prescribe magnesium for 1 month.  Multivitamin.   Generalized weakness Outpatient PT  Discharge Diagnoses: Principal Problem:   Hypocalcemia Active Problems:   Essential hypertension   Obstructive sleep apnea syndrome   Type 2 diabetes mellitus with other specified complication (HCC)   Malignant neoplasm of prostate (HCC)   Hypokalemia   Hypomagnesemia   AKI (acute kidney injury) ([place])   Leukopenia   Anemia   Diarrhea   Influenza A   GI bleed  Resolved Problems:   * No resolved hospital problems. *  Hospital Course: 55yom with PMH including prostate cancer s/p radiation, HTN, T2DM, OSA who presented with weakness.  Admitted for influenza A, hypocalcemia, hypomagnesemia, AKI.  Also chronic rectal bleeding for a month.  AKI resolved with fluids, electrolytes were repleted, patient with acute on chronic GI bleed and was investigated with GI consultation underwent EGD and colonoscopy which were unrevealing.  Suspect diverticular bleed.  Hemoglobin stabilized status post 1 unit PRBC.  Given clinical stability patient was discharged home.   Influenza A Tamiflu through 1/6, supportive care. Appears resolved.   Painless hematochezia  Anemia, normocytic Suspect diverticular bleed. B12 and folate wnl.  Had EGD/colonoscopy scheduled later this month to evaluate.  Acute on chronic.  Transfused 1 unit PRBC for hemoglobin 6.8 with appropriate rise. Hgb stable at 8.7 Today and colonoscopy unrevealing as below.  Discussed with Dr. Michail Sermon, most likely  diverticular.  Patient also has internal hemorrhoids, he will be referred by Dr. Michail Sermon to [facility] surgery for consideration of intervention. If bleeding recurs, consider a CT angiogram.   Hypocalcemia Not symptomatic, but significantly low on admission (Ca2+ 6.0 corrected)  8.3 corrected. Continue oral repletion for 1 month.   Hypomagnesemia, hypokalemia Will magnesium for 1 month.  Multivitamin.   AKI Likely 2/2 dehydration as has resolved with IV fluids Resolved.   Chronic diarrhea Recently treated for c diff, c diff negative here.  Vancomycin not indicated at this time   Leukopenia Suspect secondary to influenza.  WBC stable 3.0.  Anticipate spontaneous resolution.   Diabetes mellitus type 2, diet-controlled Follow-up as an outpatient. Fasting CBG stable at 94.   Essential hypertension. Continue carvedilol and amlodipine.  Can resume losartan on discharge.   History prostate cancer Treated with radiation therapy, recently completed   OSA Not on cpap   Generalized weakness Outpatient PT  Consultants:  GI  Procedures performed:  EGD Impression:               - Normal esophagus.                           - Z-line regular, 42 cm from the incisors.                           - Acute gastritis.                           - Non-bleeding duodenal diverticulum.                           -  No specimens collected.  Colonoscopy Impression:               - Diverticulosis in the entire examined colon.                           - Petechial mucosa in the cecum.                           - Internal hemorrhoids.                           - The examined portion of the ileum was normal.                           - No specimens collected. Disposition: Home Diet recommendation:  Regular diet DISCHARGE MEDICATION: Allergies as of 07/23/2022       Reactions   Hydrocodone Itching   Hydrocodone-acetaminophen Itching   Metformin Diarrhea        Medication List      TAKE these medications    amLODipine 10 MG tablet Commonly known as: NORVASC Take 10 mg by mouth daily.   calcium carbonate 1250 (500 Ca) MG tablet Commonly known as: OS-CAL - dosed in mg of elemental calcium Take 1 tablet (1,250 mg total) by mouth daily with breakfast. Start taking on: July 24, 2022   carvedilol 12.5 MG tablet Commonly known as: COREG Take 12.5 mg by mouth daily.   dorzolamide-timolol 2-0.5 % ophthalmic solution Commonly known as: COSOPT Place 1 drop into both eyes 2 (two) times daily.   gabapentin 300 MG capsule Commonly known as: NEURONTIN Take 900 mg by mouth as needed (nerve pain).   hydrocortisone 2.5 % cream Apply 1 Application topically 2 (two) times daily as needed (hemorrhoids).   losartan 100 MG tablet Commonly known as: COZAAR Take 100 mg by mouth daily.   Magnesium Oxide 400 MG Caps Take 1 capsule (400 mg total) by mouth daily.   multivitamin Tabs tablet Take 1 tablet by mouth daily.   oseltamivir 30 MG capsule Commonly known as: TAMIFLU Take 1 capsule (30 mg total) by mouth 2 (two) times daily for 2 days.   pravastatin 40 MG tablet Commonly known as: PRAVACHOL Take 40 mg by mouth daily.   Rocklatan 0.02-0.005 % Soln Generic drug: Netarsudil-Latanoprost Place 1 drop into both eyes at bedtime.   tamsulosin 0.4 MG Caps capsule Commonly known as: FLOMAX Take 0.4 mg by mouth daily.        Follow-up Information     Lavone Orn, MD. Schedule an appointment as soon as possible for a visit in 1 week(s).   Specialty: Internal Medicine Why: Please follow up with your primary care provider at discharge to request referral for outpatient physical therapy Contact information: [address] [place] [address] [phone]                Feels better  Discharge Exam: Danley Danker Weights   07/19/22 1730  Weight: 74.8 kg   Physical Exam Vitals reviewed.  Constitutional:      General: He is not in  acute distress.    Appearance: He is not ill-appearing or toxic-appearing.  Cardiovascular:     Rate and Rhythm: Normal rate and regular rhythm.     Heart sounds: No murmur heard. Pulmonary:  Effort: Pulmonary effort is normal. No respiratory distress.     Breath sounds: No wheezing, rhonchi or rales.  Neurological:     Mental Status: He is alert.  Psychiatric:        Mood and Affect: Mood normal.        Behavior: Behavior normal.   Condition at discharge: good  The results of significant diagnostics from this hospitalization (including imaging, microbiology, ancillary and laboratory) are listed below for reference.   Imaging Studies: DG Chest Port 1 View  Result Date: 07/19/2022 CLINICAL DATA:  Cough, weakness EXAM: PORTABLE CHEST 1 VIEW COMPARISON:  06/22/2018 FINDINGS: Cardiac size is within normal limits. There are no signs of pulmonary edema or focal pulmonary consolidation. Air soft tissue interface is seen in left lung, most likely a skin fold. There is no pleural effusion or pneumothorax. IMPRESSION: No active disease. Electronically Signed   By: Elmer Picker M.D.   On: 07/19/2022 17:19    Microbiology: Results for orders placed or performed during the hospital encounter of 07/19/22  Resp panel by RT-PCR (RSV, Flu A&B, Covid) Anterior Nasal Swab     Status: Abnormal   Collection Time: 07/19/22  3:43 PM   Specimen: Anterior Nasal Swab  Result Value Ref Range Status   SARS Coronavirus 2 by RT PCR NEGATIVE NEGATIVE Final    Comment: (NOTE) SARS-CoV-2 target nucleic acids are NOT DETECTED.  The SARS-CoV-2 RNA is generally detectable in upper respiratory specimens during the acute phase of infection. The lowest concentration of SARS-CoV-2 viral copies this assay can detect is 138 copies/mL. A negative result does not preclude SARS-Cov-2 infection and should not be used as the sole basis for treatment or other patient management decisions. A negative result may occur  with  improper specimen collection/handling, submission of specimen other than nasopharyngeal swab, presence of viral mutation(s) within the areas targeted by this assay, and inadequate number of viral copies(<138 copies/mL). A negative result must be combined with clinical observations, patient history, and epidemiological information. The expected result is Negative.  Fact Sheet for Patients:  EntrepreneurPulse.com.au  Fact Sheet for Healthcare Providers:  IncredibleEmployment.be  This test is no t yet approved or cleared by the [place] FDA and  has been authorized for detection and/or diagnosis of SARS-CoV-2 by FDA under an Emergency Use Authorization (EUA). This EUA will remain  in effect (meaning this test can be used) for the duration of the COVID-19 declaration under Section 564(b)(1) of the Act, 21 U.S.C.section 360bbb-3(b)(1), unless the authorization is terminated  or revoked sooner.       Influenza A by PCR POSITIVE (A) NEGATIVE Final   Influenza B by PCR NEGATIVE NEGATIVE Final    Comment: (NOTE) The Xpert Xpress SARS-CoV-2/FLU/RSV plus assay is intended as an aid in the diagnosis of influenza from Nasopharyngeal swab specimens and should not be used as a sole basis for treatment. Nasal washings and aspirates are unacceptable for Xpert Xpress SARS-CoV-2/FLU/RSV testing.  Fact Sheet for Patients: EntrepreneurPulse.com.au  Fact Sheet for Healthcare Providers: IncredibleEmployment.be  This test is not yet approved or cleared by the [place] FDA and has been authorized for detection and/or diagnosis of SARS-CoV-2 by FDA under an Emergency Use Authorization (EUA). This EUA will remain in effect (meaning this test can be used) for the duration of the COVID-19 declaration under Section 564(b)(1) of the Act, 21 U.S.C. section 360bbb-3(b)(1), unless the authorization is terminated  or revoked.     Resp Syncytial Virus by PCR NEGATIVE NEGATIVE  Final    Comment: (NOTE) Fact Sheet for Patients: EntrepreneurPulse.com.au  Fact Sheet for Healthcare Providers: IncredibleEmployment.be  This test is not yet approved or cleared by the [place] FDA and has been authorized for detection and/or diagnosis of SARS-CoV-2 by FDA under an Emergency Use Authorization (EUA). This EUA will remain in effect (meaning this test can be used) for the duration of the COVID-19 declaration under Section 564(b)(1) of the Act, 21 U.S.C. section 360bbb-3(b)(1), unless the authorization is terminated or revoked.  Performed at [facility], [place] [address] [place], [place] [address]   C Difficile Quick Screen w PCR reflex     Status: None   Collection Time: 07/19/22  9:39 PM   Specimen: STOOL  Result Value Ref Range Status   C Diff antigen NEGATIVE NEGATIVE Final   C Diff toxin NEGATIVE NEGATIVE Final   C Diff interpretation No C. difficile detected.  Final    Comment: Performed at [facility], [place] [address] [place], [place] [address]    Labs: CBC: Recent Labs  Lab 07/19/22 1545 07/20/22 0358 07/21/22 0557 07/22/22 0519 07/23/22 0532  WBC 3.6* 2.8* 3.2* 2.5* 3.0*  NEUTROABS 2.2 1.6*  --   --   --   HGB 8.1* 7.1* 6.8* 9.0* 8.7*  HCT 25.5* 21.6* 21.8* 28.6* 27.4*  MCV 97.0 96.9 100.9* 97.6 96.5  PLT 221 178 190 191 726   Basic Metabolic Panel: Recent Labs  Lab 07/19/22 1719 07/20/22 0358 07/21/22 0557 07/22/22 0519 07/23/22 0532  NA 137 139 133* 136 135  K 3.3* 3.2* 3.2* 4.6 4.0  CL 106 113* 106 112* 108  CO2 19* 18* 19* 20* 20*  GLUCOSE 144* 95 137* 99 94  BUN '19 12 10 '$ 7* 7*  CREATININE 2.11* 1.15 1.13 0.97 1.01  CALCIUM 5.8* 5.7* 6.5* 7.8* 7.8*  MG 0.9* 1.3* 1.7 2.0 1.7   Liver Function Tests: Recent Labs  Lab 07/19/22 1545 07/22/22 0519 07/23/22 0532  AST 38 26 20  ALT '15  15 13  '$ ALKPHOS 45 39 35*  BILITOT 0.4 0.5 0.4  PROT 6.6 5.9* 5.7*  ALBUMIN 3.0* 2.9* 2.9*   CBG: Recent Labs  Lab 07/19/22 1534  GLUCAP 152*    Discharge time spent: less than 30 minutes.  Signed: Murray Hodgkins, MD [facility] 07/23/2022

## 2022-07-23 NOTE — Transfer of Care (Signed)
Immediate Anesthesia Transfer of Care Note  Patient: Victor Garcia  Procedure(s) Performed: Procedure(s): ESOPHAGOGASTRODUODENOSCOPY (EGD) (N/A) COLONOSCOPY (N/A)  Patient Location: PACU  Anesthesia Type:General  Level of Consciousness:  sedated, patient cooperative and responds to stimulation  Airway & Oxygen Therapy:Patient Spontanous Breathing and Patient connected to face mask oxgen  Post-op Assessment:  Report given to PACU RN and Post -op Vital signs reviewed and stable  Post vital signs:  Reviewed and stable  Last Vitals:  Vitals:   07/23/22 1200 07/23/22 1307  BP: (!) 142/69 (!) 87/56  Pulse: 90 81  Resp: (!) 23 16  Temp: (!) 36.1 C (!) 36.1 C  SpO2: 517% 001%    Complications: No apparent anesthesia complications

## 2022-07-23 NOTE — Op Note (Signed)
[facility] Patient Name: Victor Garcia Procedure Date: 07/23/2022 MRN: [id number] Attending MD: Lear Ng , MD, [id number] Date of Birth: [birth year] CSN: [id number] Age: 72 Admit Type: Inpatient Procedure:                Upper GI endoscopy Indications:              Acute post hemorrhagic anemia, Hematochezia Providers:                Lear Ng, MD, Dulcy Fanny, Frazier Richards, Technician Referring MD:             hospital team Medicines:                Propofol per Anesthesia, Monitored Anesthesia Care Complications:            No immediate complications. Estimated Blood Loss:     Estimated blood loss: none. Procedure:                Pre-Anesthesia Assessment:                           - Prior to the procedure, a History and Physical                            was performed, and patient medications and                            allergies were reviewed. The patient's tolerance of                            previous anesthesia was also reviewed. The risks                            and benefits of the procedure and the sedation                            options and risks were discussed with the patient.                            All questions were answered, and informed consent                            was obtained. Prior Anticoagulants: The patient has                            taken no anticoagulant or antiplatelet agents. ASA                            Grade Assessment: II - A patient with mild systemic                            disease. After reviewing the risks and benefits,  the patient was deemed in satisfactory condition to                            undergo the procedure.                           After obtaining informed consent, the endoscope was                            passed under direct vision. Throughout the                            procedure, the patient's blood  pressure, pulse, and                            oxygen saturations were monitored continuously. The                            GIF-H190 ([id number]) Olympus endoscope was introduced                            through the mouth, and advanced to the second part                            of duodenum. The upper GI endoscopy was                            accomplished without difficulty. The patient                            tolerated the procedure well. Scope In: Scope Out: Findings:      The examined esophagus was normal.      The Z-line was regular and was found 42 cm from the incisors.      Segmental mild inflammation characterized by congestion (edema) and       erythema was found in the gastric antrum.      The cardia and gastric fundus were normal on retroflexion.      The exam of the stomach was otherwise normal.      A large non-bleeding diverticulum was found in the second portion of the       duodenum.      The exam of the duodenum was otherwise normal. Impression:               - Normal esophagus.                           - Z-line regular, 42 cm from the incisors.                           - Acute gastritis.                           - Non-bleeding duodenal diverticulum.                           - No specimens  collected. Moderate Sedation:      N/A - MAC procedure Recommendation:           - See other procedure note.                           - Observe patient's clinical course. Procedure Code(s):        --- Professional ---                           [id number], Esophagogastroduodenoscopy, flexible,                            transoral; diagnostic, including collection of                            specimen(s) by brushing or washing, when performed                            (separate procedure) Diagnosis Code(s):        --- Professional ---                           K92.1, Melena (includes Hematochezia)                           K29.00, Acute gastritis without bleeding                            D62, Acute posthemorrhagic anemia                           K57.10, Diverticulosis of small intestine without                            perforation or abscess without bleeding CPT copyright 2022 American Medical Association. All rights reserved. The codes documented in this report are preliminary and upon coder review may  be revised to meet current compliance requirements. Lear Ng, MD 07/23/2022 1:09:27 PM This report has been signed electronically. Number of Addenda: 0

## 2022-07-23 NOTE — Progress Notes (Signed)
Mobility Specialist - Progress Note   07/23/22 0907  Mobility  Activity Ambulated independently in hallway  Level of Assistance Independent  Assistive Device None  Distance Ambulated (ft) 500 ft  Activity Response Tolerated well  Mobility Referral Yes  $Mobility charge 1 Mobility   Pt received in bed and agreeable to mobility. No complaints during mobility session. Pt to recliner after session with all needs met & family in room.  [facility]

## 2022-07-25 LAB — PTH-RELATED PEPTIDE: PTH-related peptide: <2 pmol/L

## 2022-07-25 NOTE — Anesthesia Postprocedure Evaluation (Signed)
Anesthesia Post Note  Patient: Aseel Uhde  Procedure(s) Performed: ESOPHAGOGASTRODUODENOSCOPY (EGD) COLONOSCOPY     Patient location during evaluation: Endoscopy Anesthesia Type: MAC Level of consciousness: awake and alert Pain management: pain level controlled Vital Signs Assessment: post-procedure vital signs reviewed and stable Respiratory status: spontaneous breathing, nonlabored ventilation, respiratory function stable and patient connected to nasal cannula oxygen Cardiovascular status: blood pressure returned to baseline and stable Postop Assessment: no apparent nausea or vomiting Anesthetic complications: no   No notable events documented.  Last Vitals:  Vitals:   07/23/22 1320 07/23/22 1344  BP: 112/70 131/84  Pulse: 93 72  Resp: (!) 31 20  Temp:  36.9 C  SpO2: 100% 100%    Last Pain:  Vitals:   07/23/22 1320  TempSrc:   PainSc: 0-No pain                 Santa Lighter

## 2022-07-26 ENCOUNTER — Encounter (HOSPITAL_COMMUNITY): Payer: Self-pay | Admitting: Gastroenterology

## 2022-07-28 ENCOUNTER — Ambulatory Visit: Payer: Medicare PPO | Admitting: Internal Medicine

## 2022-10-05 DIAGNOSIS — D519 Vitamin B12 deficiency anemia, unspecified: Secondary | ICD-10-CM | POA: Diagnosis not present

## 2022-10-20 DIAGNOSIS — K644 Residual hemorrhoidal skin tags: Secondary | ICD-10-CM | POA: Diagnosis not present

## 2022-10-20 DIAGNOSIS — K648 Other hemorrhoids: Secondary | ICD-10-CM | POA: Diagnosis not present

## 2022-10-26 DIAGNOSIS — R197 Diarrhea, unspecified: Secondary | ICD-10-CM | POA: Diagnosis not present

## 2022-11-11 DIAGNOSIS — E1169 Type 2 diabetes mellitus with other specified complication: Secondary | ICD-10-CM | POA: Diagnosis not present

## 2022-11-11 DIAGNOSIS — C61 Malignant neoplasm of prostate: Secondary | ICD-10-CM | POA: Diagnosis not present

## 2022-11-11 DIAGNOSIS — D519 Vitamin B12 deficiency anemia, unspecified: Secondary | ICD-10-CM | POA: Diagnosis not present

## 2022-11-11 DIAGNOSIS — K219 Gastro-esophageal reflux disease without esophagitis: Secondary | ICD-10-CM | POA: Diagnosis not present

## 2022-11-11 DIAGNOSIS — E782 Mixed hyperlipidemia: Secondary | ICD-10-CM | POA: Diagnosis not present

## 2022-11-11 DIAGNOSIS — R208 Other disturbances of skin sensation: Secondary | ICD-10-CM | POA: Diagnosis not present

## 2022-11-11 DIAGNOSIS — Z0000 Encounter for general adult medical examination without abnormal findings: Secondary | ICD-10-CM | POA: Diagnosis not present

## 2022-11-11 DIAGNOSIS — Z79899 Other long term (current) drug therapy: Secondary | ICD-10-CM | POA: Diagnosis not present

## 2022-11-11 DIAGNOSIS — R197 Diarrhea, unspecified: Secondary | ICD-10-CM | POA: Diagnosis not present

## 2022-11-11 DIAGNOSIS — I10 Essential (primary) hypertension: Secondary | ICD-10-CM | POA: Diagnosis not present

## 2022-11-15 DIAGNOSIS — A0472 Enterocolitis due to Clostridium difficile, not specified as recurrent: Secondary | ICD-10-CM | POA: Diagnosis not present

## 2022-12-06 DIAGNOSIS — Z79899 Other long term (current) drug therapy: Secondary | ICD-10-CM | POA: Diagnosis not present

## 2022-12-16 DIAGNOSIS — E119 Type 2 diabetes mellitus without complications: Secondary | ICD-10-CM | POA: Diagnosis not present

## 2022-12-16 DIAGNOSIS — K649 Unspecified hemorrhoids: Secondary | ICD-10-CM | POA: Diagnosis not present

## 2022-12-16 DIAGNOSIS — K529 Noninfective gastroenteritis and colitis, unspecified: Secondary | ICD-10-CM | POA: Diagnosis not present

## 2022-12-17 DIAGNOSIS — H401131 Primary open-angle glaucoma, bilateral, mild stage: Secondary | ICD-10-CM | POA: Diagnosis not present

## 2022-12-17 DIAGNOSIS — H25013 Cortical age-related cataract, bilateral: Secondary | ICD-10-CM | POA: Diagnosis not present

## 2022-12-23 DIAGNOSIS — Z79899 Other long term (current) drug therapy: Secondary | ICD-10-CM | POA: Diagnosis not present

## 2022-12-31 DIAGNOSIS — K644 Residual hemorrhoidal skin tags: Secondary | ICD-10-CM | POA: Diagnosis not present

## 2022-12-31 DIAGNOSIS — K648 Other hemorrhoids: Secondary | ICD-10-CM | POA: Diagnosis not present

## 2023-01-11 DIAGNOSIS — R748 Abnormal levels of other serum enzymes: Secondary | ICD-10-CM | POA: Diagnosis not present

## 2023-01-17 DIAGNOSIS — N179 Acute kidney failure, unspecified: Secondary | ICD-10-CM | POA: Diagnosis not present

## 2023-01-17 DIAGNOSIS — F1090 Alcohol use, unspecified, uncomplicated: Secondary | ICD-10-CM | POA: Diagnosis not present

## 2023-01-17 DIAGNOSIS — D649 Anemia, unspecified: Secondary | ICD-10-CM | POA: Diagnosis not present

## 2023-02-04 DIAGNOSIS — N179 Acute kidney failure, unspecified: Secondary | ICD-10-CM | POA: Diagnosis not present

## 2023-02-07 DIAGNOSIS — K644 Residual hemorrhoidal skin tags: Secondary | ICD-10-CM | POA: Diagnosis not present

## 2023-02-07 DIAGNOSIS — K648 Other hemorrhoids: Secondary | ICD-10-CM | POA: Diagnosis not present

## 2023-02-21 DIAGNOSIS — C61 Malignant neoplasm of prostate: Secondary | ICD-10-CM | POA: Diagnosis not present

## 2023-02-21 DIAGNOSIS — Z8546 Personal history of malignant neoplasm of prostate: Secondary | ICD-10-CM | POA: Diagnosis not present

## 2023-02-28 DIAGNOSIS — R3915 Urgency of urination: Secondary | ICD-10-CM | POA: Diagnosis not present

## 2023-02-28 DIAGNOSIS — Z8546 Personal history of malignant neoplasm of prostate: Secondary | ICD-10-CM | POA: Diagnosis not present

## 2023-02-28 DIAGNOSIS — N5201 Erectile dysfunction due to arterial insufficiency: Secondary | ICD-10-CM | POA: Diagnosis not present

## 2023-03-16 DIAGNOSIS — I10 Essential (primary) hypertension: Secondary | ICD-10-CM | POA: Diagnosis not present

## 2023-03-16 DIAGNOSIS — R197 Diarrhea, unspecified: Secondary | ICD-10-CM | POA: Diagnosis not present

## 2023-03-16 DIAGNOSIS — E119 Type 2 diabetes mellitus without complications: Secondary | ICD-10-CM | POA: Diagnosis not present

## 2023-03-16 DIAGNOSIS — D519 Vitamin B12 deficiency anemia, unspecified: Secondary | ICD-10-CM | POA: Diagnosis not present

## 2023-03-16 DIAGNOSIS — K219 Gastro-esophageal reflux disease without esophagitis: Secondary | ICD-10-CM | POA: Diagnosis not present

## 2023-03-16 DIAGNOSIS — E1169 Type 2 diabetes mellitus with other specified complication: Secondary | ICD-10-CM | POA: Diagnosis not present

## 2023-03-16 DIAGNOSIS — D649 Anemia, unspecified: Secondary | ICD-10-CM | POA: Diagnosis not present

## 2023-03-16 DIAGNOSIS — F1090 Alcohol use, unspecified, uncomplicated: Secondary | ICD-10-CM | POA: Diagnosis not present

## 2023-03-16 DIAGNOSIS — E782 Mixed hyperlipidemia: Secondary | ICD-10-CM | POA: Diagnosis not present

## 2023-03-16 DIAGNOSIS — Z79899 Other long term (current) drug therapy: Secondary | ICD-10-CM | POA: Diagnosis not present

## 2023-03-16 DIAGNOSIS — K649 Unspecified hemorrhoids: Secondary | ICD-10-CM | POA: Diagnosis not present

## 2023-03-23 DIAGNOSIS — K648 Other hemorrhoids: Secondary | ICD-10-CM | POA: Diagnosis not present

## 2023-03-23 DIAGNOSIS — K644 Residual hemorrhoidal skin tags: Secondary | ICD-10-CM | POA: Diagnosis not present

## 2023-03-25 DIAGNOSIS — H401131 Primary open-angle glaucoma, bilateral, mild stage: Secondary | ICD-10-CM | POA: Diagnosis not present

## 2023-03-25 DIAGNOSIS — H25013 Cortical age-related cataract, bilateral: Secondary | ICD-10-CM | POA: Diagnosis not present

## 2023-04-04 ENCOUNTER — Encounter (HOSPITAL_BASED_OUTPATIENT_CLINIC_OR_DEPARTMENT_OTHER): Payer: Self-pay | Admitting: General Surgery

## 2023-04-04 ENCOUNTER — Other Ambulatory Visit: Payer: Self-pay

## 2023-04-08 ENCOUNTER — Encounter (HOSPITAL_COMMUNITY): Payer: Self-pay | Admitting: General Surgery

## 2023-04-08 ENCOUNTER — Ambulatory Visit: Payer: Self-pay | Admitting: General Surgery

## 2023-04-08 NOTE — Progress Notes (Signed)
Anesthesia Chart Review:  Case: [id number] Date/Time: 04/11/23 0915   Procedure: EXTERNAL HEMORRHOIDECTOMY, POSSIBLE INTERNAL   Anesthesia type: General   Pre-op diagnosis: EXTERNAL AND INTERNAL HEMORRHOID   Location: [facility] ROOM 02 / [facility]   Surgeons: Violeta Gelinas, MD       DISCUSSION: Patient is a 72 year old male scheduled for the above procedure.   History includes never smoker, HTN, DM2, OSA (CPAP), GERD, C. Difficile colitis, anemia, prostate cancer (s/p Gold seeds 09/23/20). Primary care note indicate he has a history of heavy alcohol use, but that he had recently made "considerable efforts to cut back his alcohol use...does not drink more than 2-3 drinks on any given night. He no longer drinks every night."  [name] admission 07/19/22 - 07/23/22 for diffuse weakness and found to have influenza A, critical hypocalcemia of 5.6, hypomagnesemia, and AKI with Creatinine 2.15. He also reported chronic rectal bleeding x 1 month. H/H 7.2/32.6 (low HGB 6.8, s/p 1 unit PRBC). AKI resulted with IVF. Electrolytes repleted. Tamiflu prescribed for flu. GI consulted for rectal bleeding and underwent EGD and colonoscopy which showed diverticulosis, internal hemorrhoids, acute gastritis, no-bleeding duodenal diverticulum. GI thought bleeding may be for diverticular bleed, but referred to general surgery for evaluation of hemorrhoids to consider intervention. If bleeding recurs, then consider CTA. H/H up to 8.7/27.4 and Calcium 7.8 at discharge.   He is a same day work-up, so labs as indicated and anesthesia team evaluation on the day of surgery.    VS: Ht 5\' 10"  (1.778 m)   Wt 74 kg   BMI 23.41 kg/m  BP Readings from Last 3 Encounters:  07/23/22 131/84  08/26/20 (!) 149/79  03/05/17 136/65   Pulse Readings from Last 3 Encounters:  07/23/22 72  08/26/20 96  03/05/17 75    PROVIDERS: Emilio Aspen, MD is PCP  Berniece Salines, MD is urologist  LABS: For day of surgery as  indicated. Labs in [facility] include A1c 5.9%, glucose 145, BUN 15, Creatinine 1.10, eGFR 71, Na 138, K 4.2, Calcium 8.7, corrected Ca 8.81, albumin 3.8, total bili 0.6, AST  30, ALT 16 on 03/16/23. By 03/16/23 office note, "   IMAGES: 1V PCXR 07/19/22: FINDINGS: Cardiac size is within normal limits. There are no signs of pulmonary edema or focal pulmonary consolidation. Air soft tissue interface is seen in left lung, most likely a skin fold. There is no pleural effusion or pneumothorax. IMPRESSION: No active disease.    EKG: 07/19/22: Sinus rhythm Abnormal R-wave progression, early transition Borderline T wave abnormalities Confirmed by Ernie Avena ([id number]) on 07/19/2022 3:47:28 PM  CV: N/A  Past Medical History:  Diagnosis Date   Anemia    Diabetes mellitus without complication (HCC)    GERD (gastroesophageal reflux disease)    Hypertension    Prostate cancer (HCC)    Sleep apnea    cpap    Past Surgical History:  Procedure Laterality Date   COLONOSCOPY N/A 07/23/2022   Procedure: COLONOSCOPY;  Surgeon: Charlott Rakes, MD;  Location: [facility] ENDOSCOPY;  Service: Gastroenterology;  Laterality: N/A;   COLONOSCOPY WITH PROPOFOL N/A 10/07/2014   Procedure: COLONOSCOPY WITH PROPOFOL;  Surgeon: Charolett Bumpers, MD;  Location: [facility] ENDOSCOPY;  Service: Endoscopy;  Laterality: N/A;   ESOPHAGOGASTRODUODENOSCOPY N/A 07/23/2022   Procedure: ESOPHAGOGASTRODUODENOSCOPY (EGD);  Surgeon: Charlott Rakes, MD;  Location: [facility];  Service: Gastroenterology;  Laterality: N/A;   FOOT FRACTURE SURGERY Right    ORIF   LEG SURGERY  Left    Left leg surgery for" fracture near hip bone"   PROSTATE BIOPSY      Shonna Chock, PA-C Surgical Short Stay/Anesthesiology [facility] Phone [phone] [facility] Phone [phone] 04/08/2023 5:19 PM  MEDICATIONS: No current facility-administered medications for this encounter.    amLODipine (NORVASC) 10 MG tablet   carvedilol (COREG) 12.5 MG tablet    dorzolamide-timolol (COSOPT) 2-0.5 % ophthalmic solution   gabapentin (NEURONTIN) 300 MG capsule   hydrALAZINE (APRESOLINE) 50 MG tablet   hydrocortisone 2.5 % cream   multivitamin (ONE-A-DAY MEN'S) TABS tablet   pravastatin (PRAVACHOL) 40 MG tablet   ROCKLATAN 0.02-0.005 % SOLN   tamsulosin (FLOMAX) 0.4 MG CAPS capsule    Shonna Chock, PA-C Surgical Short Stay/Anesthesiology [facility] Phone [phone] [facility] Phone [phone] 04/08/2023 5:20 PM

## 2023-04-08 NOTE — Progress Notes (Signed)
SDW call  Patient was given pre-op instructions over the phone. Patient verbalized understanding of instructions provided.     PCP - Dr. Eleanora Neighbor Cardiologist -  Pulmonary:    PPM/ICD - denies Device Orders - n/a Rep Notified - n/a   Chest x-ray - 07/28/2022 EKG -  07/22/2022 Stress Test - ECHO -  Cardiac Cath -   Sleep Study/sleep apnea/CPAP: diagnosed with sleep apnea, does not wear his CPAP  Type II diabetic.  States they took him off all his diabetic medication and he no longer checks his blood sugar Fasting Blood sugar range: n/a How often check sugars: never   Blood Thinner Instructions: denies Aspirin Instructions:denies   ERAS Protcol - Clear fluids until 0630   COVID TEST- n/a    Anesthesia review: Yes. HTN, DM, sleep apnea   Patient denies shortness of breath, fever, cough and chest pain over the phone call  Your procedure is scheduled on Monday April 11, 2023  Report to [facility] Main Entrance "A" at  0700  A.M., then check in with the Admitting office.  Call this number if you have problems the morning of surgery:  [phone]   If you have any questions prior to your surgery date call [phone]: Open Monday-Friday 8am-4pm If you experience any cold or flu symptoms such as cough, fever, chills, shortness of breath, etc. between now and your scheduled surgery, please notify us at the above number     Remember:  Do not eat after midnight the night before your surgery  You may drink clear liquids until  0630  the morning of your surgery.   Clear liquids allowed are: Water, Non-Citrus Juices (without pulp), Carbonated Beverages, Clear Tea, Black Coffee ONLY (NO MILK, CREAM OR POWDERED CREAMER of any kind), and Gatorade   Take these medicines the morning of surgery with A SIP OF WATER:  Amlodipine, carvedilol, eye drops, hydralazine, pravastatin, flomax  As needed: gabapentin  As of today, STOP taking any Aspirin (unless otherwise  instructed by your surgeon) Aleve, Naproxen, Ibuprofen, Motrin, Advil, Goody's, BC's, all herbal medications, fish oil, and all vitamins.

## 2023-04-09 NOTE — Anesthesia Preprocedure Evaluation (Signed)
Anesthesia Evaluation  Patient identified by MRN, date of birth, ID band Patient awake    Reviewed: Allergy & Precautions, NPO status , Patient's Chart, lab work & pertinent test results  Airway Mallampati: II  TM Distance: >3 FB Neck ROM: Full    Dental  (+) Poor Dentition, Dental Advisory Given,    Pulmonary sleep apnea    breath sounds clear to auscultation       Cardiovascular hypertension, Pt. on medications and Pt. on home beta blockers  Rhythm:Regular Rate:Normal     Neuro/Psych negative neurological ROS  negative psych ROS   GI/Hepatic Neg liver ROS,GERD  ,,  Endo/Other  diabetes    Renal/GU Renal disease     Musculoskeletal  (+) Arthritis ,    Abdominal   Peds  Hematology   Anesthesia Other Findings   Reproductive/Obstetrics                             Anesthesia Physical Anesthesia Plan  ASA: 2  Anesthesia Plan: General   Post-op Pain Management: Tylenol PO (pre-op)* and Toradol IV (intra-op)*   Induction: Intravenous  PONV Risk Score and Plan: 3 and Ondansetron, Dexamethasone, Midazolam and Treatment may vary due to age or medical condition  Airway Management Planned: Oral ETT  Additional Equipment: None  Intra-op Plan:   Post-operative Plan: Extubation in OR  Informed Consent: I have reviewed the patients History and Physical, chart, labs and discussed the procedure including the risks, benefits and alternatives for the proposed anesthesia with the patient or authorized representative who has indicated his/her understanding and acceptance.     Dental advisory given  Plan Discussed with:   Anesthesia Plan Comments:        Anesthesia Quick Evaluation

## 2023-04-11 ENCOUNTER — Ambulatory Visit (HOSPITAL_COMMUNITY)
Admission: RE | Disposition: A | Discharge status: Home / Self Care | Payer: Self-pay | Source: Home / Self Care | Attending: General Surgery

## 2023-04-11 ENCOUNTER — Other Ambulatory Visit (HOSPITAL_COMMUNITY): Payer: Self-pay

## 2023-04-11 ENCOUNTER — Ambulatory Visit (HOSPITAL_COMMUNITY)
Admission: RE | Admit: 2023-04-11 | Discharge: 2023-04-11 | Disposition: A | Discharge status: Home / Self Care | Payer: Medicare PPO | Attending: General Surgery | Admitting: General Surgery

## 2023-04-11 ENCOUNTER — Encounter (HOSPITAL_COMMUNITY): Payer: Self-pay | Admitting: General Surgery

## 2023-04-11 ENCOUNTER — Ambulatory Visit (HOSPITAL_COMMUNITY): Payer: Self-pay | Admitting: Certified Registered"

## 2023-04-11 ENCOUNTER — Other Ambulatory Visit: Payer: Self-pay

## 2023-04-11 DIAGNOSIS — K648 Other hemorrhoids: Secondary | ICD-10-CM | POA: Diagnosis not present

## 2023-04-11 DIAGNOSIS — I10 Essential (primary) hypertension: Secondary | ICD-10-CM | POA: Diagnosis not present

## 2023-04-11 DIAGNOSIS — E119 Type 2 diabetes mellitus without complications: Secondary | ICD-10-CM | POA: Insufficient documentation

## 2023-04-11 DIAGNOSIS — Z8546 Personal history of malignant neoplasm of prostate: Secondary | ICD-10-CM | POA: Diagnosis not present

## 2023-04-11 DIAGNOSIS — G4730 Sleep apnea, unspecified: Secondary | ICD-10-CM | POA: Diagnosis not present

## 2023-04-11 DIAGNOSIS — K219 Gastro-esophageal reflux disease without esophagitis: Secondary | ICD-10-CM | POA: Diagnosis not present

## 2023-04-11 DIAGNOSIS — K644 Residual hemorrhoidal skin tags: Secondary | ICD-10-CM | POA: Insufficient documentation

## 2023-04-11 HISTORY — PX: HEMORRHOID SURGERY: SHX153

## 2023-04-11 HISTORY — DX: Anemia, unspecified: D64.9

## 2023-04-11 LAB — COMPREHENSIVE METABOLIC PANEL
ALT: 16 U/L (ref 0–44)
AST: 30 U/L (ref 15–41)
Albumin: 3.2 g/dL — ABNORMAL LOW (ref 3.5–5.0)
Alkaline Phosphatase: 73 U/L (ref 38–126)
Anion gap: 12 (ref 5–15)
BUN: 11 mg/dL (ref 8–23)
CO2: 18 mmol/L — ABNORMAL LOW (ref 22–32)
Calcium: 8.1 mg/dL — ABNORMAL LOW (ref 8.9–10.3)
Chloride: 107 mmol/L (ref 98–111)
Creatinine, Ser: 1.08 mg/dL (ref 0.61–1.24)
GFR, Estimated: >60 mL/min (ref >60)
Glucose, Bld: 125 mg/dL — ABNORMAL HIGH (ref 70–99)
Potassium: 3 mmol/L — ABNORMAL LOW (ref 3.5–5.1)
Sodium: 137 mmol/L (ref 135–145)
Total Bilirubin: 0.5 mg/dL (ref 0.3–1.2)
Total Protein: 7.3 g/dL (ref 6.5–8.1)

## 2023-04-11 LAB — CBC
HCT: 38 % — ABNORMAL LOW (ref 39.0–52.0)
Hemoglobin: 12.4 g/dL — ABNORMAL LOW (ref 13.0–17.0)
MCH: 30.5 pg (ref 26.0–34.0)
MCHC: 32.6 g/dL (ref 30.0–36.0)
MCV: 93.4 fL (ref 80.0–100.0)
Platelets: 288 10*3/uL (ref 150–400)
RBC: 4.07 MIL/uL — ABNORMAL LOW (ref 4.22–5.81)
RDW: 13.5 % (ref 11.5–15.5)
WBC: 5.4 10*3/uL (ref 4.0–10.5)
nRBC: 0 % (ref 0.0–0.2)

## 2023-04-11 LAB — GLUCOSE, CAPILLARY
Glucose-Capillary: 139 mg/dL — ABNORMAL HIGH (ref 70–99)
Glucose-Capillary: 138 mg/dL — ABNORMAL HIGH (ref 70–99)

## 2023-04-11 SURGERY — HEMORRHOIDECTOMY
Anesthesia: General | Site: Buttocks

## 2023-04-11 MED ORDER — 0.9 % SODIUM CHLORIDE (POUR BTL) OPTIME
TOPICAL | Status: DC | PRN
Start: 2023-04-11 — End: 2023-04-11
  Administered 2023-04-11: 1000 mL

## 2023-04-11 MED ORDER — BUPIVACAINE-EPINEPHRINE (PF) 0.5% -1:200000 IJ SOLN
INTRAMUSCULAR | Status: DC | PRN
  Administered 2023-04-11: 40 mL

## 2023-04-11 MED ORDER — CHLORHEXIDINE GLUCONATE CLOTH 2 % EX PADS: 6.0000 | MEDICATED_PAD | Freq: Once | CUTANEOUS | Status: DC

## 2023-04-11 MED ORDER — ONDANSETRON HCL 4 MG/2ML IJ SOLN
INTRAMUSCULAR | Status: DC | PRN
Start: 2023-04-11 — End: 2023-04-11
  Administered 2023-04-11: 4 mg via INTRAVENOUS

## 2023-04-11 MED ORDER — ACETAMINOPHEN 500 MG PO TABS
1000.0000 mg | ORAL_TABLET | ORAL | Status: AC
  Administered 2023-04-11: 1000 mg via ORAL
  Filled 2023-04-11: qty 2

## 2023-04-11 MED ORDER — ORAL CARE MOUTH RINSE: 15.0000 mL | Freq: Once | OROMUCOSAL | Status: AC

## 2023-04-11 MED ORDER — MIDAZOLAM HCL 2 MG/2ML IJ SOLN
INTRAMUSCULAR | Status: AC
  Filled 2023-04-11: qty 2

## 2023-04-11 MED ORDER — FENTANYL CITRATE (PF) 250 MCG/5ML IJ SOLN
INTRAMUSCULAR | Status: AC
  Filled 2023-04-11: qty 5

## 2023-04-11 MED ORDER — OXYCODONE HCL 5 MG/5ML PO SOLN: 5.0000 mg | Freq: Once | ORAL | Status: DC | PRN

## 2023-04-11 MED ORDER — DROPERIDOL 2.5 MG/ML IJ SOLN: 0.6250 mg | Freq: Once | INTRAMUSCULAR | Status: DC | PRN

## 2023-04-11 MED ORDER — OXYCODONE HCL 5 MG PO TABS: 5.0000 mg | ORAL_TABLET | Freq: Once | ORAL | Status: DC | PRN

## 2023-04-11 MED ORDER — PROPOFOL 10 MG/ML IV BOLUS
INTRAVENOUS | Status: AC
  Filled 2023-04-11: qty 20

## 2023-04-11 MED ORDER — MIDAZOLAM HCL 2 MG/2ML IJ SOLN
INTRAMUSCULAR | Status: DC | PRN
Start: 2023-04-11 — End: 2023-04-11
  Administered 2023-04-11: 2 mg via INTRAVENOUS

## 2023-04-11 MED ORDER — OXYCODONE HCL 5 MG PO TABS
5.0000 mg | ORAL_TABLET | Freq: Four times a day (QID) | ORAL | 0 refills | Status: AC | PRN
  Filled 2023-04-11: qty 20, 5d supply, fill #0

## 2023-04-11 MED ORDER — THROMBIN 20000 UNITS EX KIT: PACK | CUTANEOUS | Status: DC | PRN

## 2023-04-11 MED ORDER — DEXAMETHASONE SODIUM PHOSPHATE 10 MG/ML IJ SOLN
INTRAMUSCULAR | Status: DC | PRN
Start: 2023-04-11 — End: 2023-04-11
  Administered 2023-04-11: 4 mg via INTRAVENOUS

## 2023-04-11 MED ORDER — LACTATED RINGERS IV SOLN: INTRAVENOUS | Status: DC

## 2023-04-11 MED ORDER — BUPIVACAINE-EPINEPHRINE (PF) 0.5% -1:200000 IJ SOLN
INTRAMUSCULAR | Status: AC
  Filled 2023-04-11: qty 30

## 2023-04-11 MED ORDER — ENSURE PRE-SURGERY PO LIQD: 296.0000 mL | Freq: Once | ORAL | Status: DC

## 2023-04-11 MED ORDER — THROMBIN 20000 UNITS EX SOLR
CUTANEOUS | Status: AC
  Filled 2023-04-11: qty 20000

## 2023-04-11 MED ORDER — SUCCINYLCHOLINE CHLORIDE 200 MG/10ML IV SOSY
PREFILLED_SYRINGE | INTRAVENOUS | Status: DC | PRN
Start: 2023-04-11 — End: 2023-04-11
  Administered 2023-04-11: 120 mg via INTRAVENOUS

## 2023-04-11 MED ORDER — LIDOCAINE HCL (CARDIAC) PF 100 MG/5ML IV SOSY
PREFILLED_SYRINGE | INTRAVENOUS | Status: DC | PRN
  Administered 2023-04-11: 50 mg via INTRATRACHEAL

## 2023-04-11 MED ORDER — THROMBIN 20000 UNITS EX KIT
PACK | CUTANEOUS | Status: AC
  Filled 2023-04-11: qty 1

## 2023-04-11 MED ORDER — ACETAMINOPHEN 10 MG/ML IV SOLN: 1000.0000 mg | Freq: Once | INTRAVENOUS | Status: DC | PRN

## 2023-04-11 MED ORDER — ACETAMINOPHEN 325 MG PO TABS: 325.0000 mg | ORAL_TABLET | ORAL | Status: DC | PRN

## 2023-04-11 MED ORDER — PROPOFOL 10 MG/ML IV BOLUS
INTRAVENOUS | Status: DC | PRN
Start: 2023-04-11 — End: 2023-04-11
  Administered 2023-04-11: 30 mg via INTRAVENOUS
  Administered 2023-04-11: 140 mg via INTRAVENOUS
  Administered 2023-04-11: 30 mg via INTRAVENOUS

## 2023-04-11 MED ORDER — ACETAMINOPHEN 160 MG/5ML PO SOLN: 325.0000 mg | ORAL | Status: DC | PRN

## 2023-04-11 MED ORDER — SODIUM CHLORIDE 0.9 % IV SOLN
2.0000 g | INTRAVENOUS | Status: AC
  Administered 2023-04-11: 2 g via INTRAVENOUS
  Filled 2023-04-11: qty 2

## 2023-04-11 MED ORDER — BUPIVACAINE LIPOSOME 1.3 % IJ SUSP: 20.0000 mL | Freq: Once | INTRAMUSCULAR | Status: DC

## 2023-04-11 MED ORDER — FENTANYL CITRATE (PF) 250 MCG/5ML IJ SOLN
INTRAMUSCULAR | Status: DC | PRN
Start: 2023-04-11 — End: 2023-04-11
  Administered 2023-04-11: 75 ug via INTRAVENOUS

## 2023-04-11 MED ORDER — FENTANYL CITRATE (PF) 100 MCG/2ML IJ SOLN: 25.0000 ug | INTRAMUSCULAR | Status: DC | PRN

## 2023-04-11 MED ORDER — CHLORHEXIDINE GLUCONATE 0.12 % MT SOLN
15.0000 mL | Freq: Once | OROMUCOSAL | Status: AC
  Administered 2023-04-11: 15 mL via OROMUCOSAL
  Filled 2023-04-11: qty 15

## 2023-04-11 MED ORDER — BUPIVACAINE LIPOSOME 1.3 % IJ SUSP
INTRAMUSCULAR | Status: AC
  Filled 2023-04-11: qty 20

## 2023-04-11 MED ORDER — PROMETHAZINE HCL 25 MG/ML IJ SOLN: 6.2500 mg | INTRAMUSCULAR | Status: DC | PRN

## 2023-04-11 SURGICAL SUPPLY — 39 items
BAG COUNTER SPONGE SURGICOUNT (BAG) ×1 IMPLANT
BAG SPNG CNTER NS LX DISP (BAG) ×1
BLADE SURG 15 STRL LF DISP TIS (BLADE) IMPLANT
BLADE SURG 15 STRL SS (BLADE) ×1
CANISTER SUCT 3000ML PPV (MISCELLANEOUS) ×1 IMPLANT
COVER MAYO STAND STRL (DRAPES) ×1 IMPLANT
COVER SURGICAL LIGHT HANDLE (MISCELLANEOUS) ×1 IMPLANT
DRAPE UTILITY XL STRL (DRAPES) ×1 IMPLANT
ELECT REM PT RETURN 9FT ADLT (ELECTROSURGICAL) ×1
ELECTRODE REM PT RTRN 9FT ADLT (ELECTROSURGICAL) ×1 IMPLANT
GAUZE 4X4 16PLY ~~LOC~~+RFID DBL (SPONGE) ×1 IMPLANT
GAUZE PAD ABD 8X10 STRL (GAUZE/BANDAGES/DRESSINGS) IMPLANT
GAUZE SPONGE 4X4 12PLY STRL (GAUZE/BANDAGES/DRESSINGS) ×1 IMPLANT
GLOVE BIO SURGEON STRL SZ8 (GLOVE) ×1 IMPLANT
GLOVE BIOGEL PI IND STRL 8 (GLOVE) ×1 IMPLANT
GOWN STRL REUS W/ TWL LRG LVL3 (GOWN DISPOSABLE) ×1 IMPLANT
GOWN STRL REUS W/ TWL XL LVL3 (GOWN DISPOSABLE) ×1 IMPLANT
GOWN STRL REUS W/TWL LRG LVL3 (GOWN DISPOSABLE) ×1
GOWN STRL REUS W/TWL XL LVL3 (GOWN DISPOSABLE) ×1
KIT BASIN OR (CUSTOM PROCEDURE TRAY) ×1 IMPLANT
KIT SIGMOIDOSCOPE (SET/KITS/TRAYS/PACK) IMPLANT
KIT TURNOVER KIT B (KITS) ×1 IMPLANT
NDL 22X1.5 STRL (OR ONLY) (MISCELLANEOUS) ×1 IMPLANT
NEEDLE 22X1.5 STRL (OR ONLY) (MISCELLANEOUS) ×1 IMPLANT
NS IRRIG 1000ML POUR BTL (IV SOLUTION) ×1 IMPLANT
PACK LITHOTOMY IV (CUSTOM PROCEDURE TRAY) ×1 IMPLANT
PAD ARMBOARD 7.5X6 YLW CONV (MISCELLANEOUS) ×2 IMPLANT
PENCIL SMOKE EVACUATOR (MISCELLANEOUS) ×1 IMPLANT
SHEARS HARMONIC 9CM CVD (BLADE) ×1 IMPLANT
SPECIMEN JAR SMALL (MISCELLANEOUS) ×1 IMPLANT
SPONGE SURGIFOAM ABS GEL 100 (HEMOSTASIS) IMPLANT
SURGILUBE 2OZ TUBE FLIPTOP (MISCELLANEOUS) ×1 IMPLANT
SUT CHROMIC 2 0 SH (SUTURE) ×1 IMPLANT
SUT CHROMIC 3 0 SH 27 (SUTURE) ×1 IMPLANT
SYR CONTROL 10ML LL (SYRINGE) ×1 IMPLANT
TOWEL GREEN STERILE FF (TOWEL DISPOSABLE) ×2 IMPLANT
TUBE CONNECTING 12X1/4 (SUCTIONS) ×1 IMPLANT
UNDERPAD 30X36 HEAVY ABSORB (UNDERPADS AND DIAPERS) ×1 IMPLANT
YANKAUER SUCT BULB TIP NO VENT (SUCTIONS) ×1 IMPLANT

## 2023-04-11 NOTE — Anesthesia Procedure Notes (Signed)
Procedure Name: Intubation Date/Time: 04/11/2023 9:08 AM  Performed by: Shelton Silvas, MDPre-anesthesia Checklist: Patient identified, Emergency Drugs available, Suction available and Patient being monitored Patient Re-evaluated:Patient Re-evaluated prior to induction Oxygen Delivery Method: Circle system utilized Preoxygenation: Pre-oxygenation with 100% oxygen Induction Type: IV induction Ventilation: Mask ventilation without difficulty Laryngoscope Size: Mac and 4 Grade View: Grade I Tube type: Oral Tube size: 7.5 mm Number of attempts: 1 Airway Equipment and Method: Stylet and Oral airway Placement Confirmation: ETT inserted through vocal cords under direct vision, positive ETCO2 and breath sounds checked- equal and bilateral Secured at: 22 cm Tube secured with: Tape Dental Injury: Teeth and Oropharynx as per pre-operative assessment  Comments: Performed by SRNA.

## 2023-04-11 NOTE — Anesthesia Postprocedure Evaluation (Signed)
Anesthesia Post Note  Patient: Victor Garcia  Procedure(s) Performed: EXTERNAL AND INTERNAL HEMORRHOIDECTOMY (Buttocks)     Patient location during evaluation: PACU Anesthesia Type: General Level of consciousness: awake and alert Pain management: pain level controlled Vital Signs Assessment: post-procedure vital signs reviewed and stable Respiratory status: spontaneous breathing, nonlabored ventilation, respiratory function stable and patient connected to nasal cannula oxygen Cardiovascular status: blood pressure returned to baseline and stable Postop Assessment: no apparent nausea or vomiting Anesthetic complications: no  No notable events documented.  Last Vitals:  Vitals:   04/11/23 1015 04/11/23 1030  BP: 126/75 132/79  Pulse: 73 72  Resp: 14 18  Temp:  36.5 C  SpO2: 96% 96%    Last Pain:  Vitals:   04/11/23 1030  PainSc: 0-No pain                 Shelton Silvas

## 2023-04-11 NOTE — Op Note (Signed)
  04/11/2023  9:46 AM  PATIENT:  Victor Garcia  72 y.o. male  PRE-OPERATIVE DIAGNOSIS:  EXTERNAL AND INTERNAL HEMORRHOID  POST-OPERATIVE DIAGNOSIS:  EXTERNAL AND INTERNAL HEMORRHOID  PROCEDURE:  Procedure(s): EXTERNAL HEMORRHOIDECTOMY INTERNAL HEMORRHOIDECTOMY SINGLE COLUMN  SURGEON:  Surgeon(s): Violeta Gelinas, MD  ASSISTANTS: none   ANESTHESIA:   local and general  EBL:  Total I/O In: -  Out: 10 [Blood:10]  BLOOD ADMINISTERED:none  DRAINS: none   SPECIMEN:  Excision  DISPOSITION OF SPECIMEN:  PATHOLOGY  COUNTS:  YES  DICTATION: .Dragon Dictation Procedure in detail: Informed consent was obtained.  He received intravenous antibiotics.  He was brought to the operating room.  General endotracheal anesthesia was administered by the anesthesia staff.  He was placed in lithotomy position with appropriate padding.  His perianal region was prepped and draped in a sterile fashion.  We did a timeout procedure.  I injected a 50-50 mix of Exparel rel with local in the perianal region for postoperative pain relief.  I then proceed with examination under anesthesia.  He has a large external hemorrhoid anteriorly.  He had 1 bilobed internal hemorrhoid that also looked like it had been bleeding on the right side.  There were no other significant internal hemorrhoids or masses.  Attention was directed back to the external hemorrhoid.  I am incised the anoderm with a scalpel.  I then carefully dissected the hemorrhoidal tissue up off of the deeper tissues using a harmonic scalpel and removed it.  I stayed superficial to the sphincters.  Specimen was sent to pathology.  Attention was then directed to this bilobed internal hemorrhoid around the right side.  The rectal mucosa was incised and then I removed both lobes of this internal hemorrhoid single column with the harmonic scalpel staying superficial to the sphincters.  I used the cautery to get excellent hemostasis after that.  I then used some  cautery on the external hemorrhoid site as well.  The area was hemostatic.  Using a rectal retractor, I then reinspected all of the rectum.  There was no significant remaining internal hemorrhoids.  There was excellent hemostasis.  An endorectal dressing of Gelfoam roll soaked in thrombin was placed.  All counts were correct.  He tolerated the procedure well without apparent complication and was taken recovery in stable condition. PATIENT DISPOSITION:  PACU - hemodynamically stable.   Delay start of Pharmacological VTE agent (>24hrs) due to surgical blood loss or risk of bleeding:  no  Violeta Gelinas, MD, MPH, FACS Pager: [phone]  9/23/20249:46 AM

## 2023-04-11 NOTE — H&P (Signed)
Victor Garcia is an 72 y.o. male.   Chief Complaint: hemorrhoids HPI: Presents for external hemorrhoidectomy and possible internal hemorrhoidectomy.  He continues to have some bleeding with bowel movements and a lot of itching.  No other changes since I saw him in the office.  Past Medical History:  Diagnosis Date   Anemia    Diabetes mellitus without complication (HCC)    GERD (gastroesophageal reflux disease)    Hypertension    Prostate cancer (HCC)    Sleep apnea    cpap    Past Surgical History:  Procedure Laterality Date   COLONOSCOPY N/A 07/23/2022   Procedure: COLONOSCOPY;  Surgeon: Charlott Rakes, MD;  Location: [facility];  Service: Gastroenterology;  Laterality: N/A;   COLONOSCOPY WITH PROPOFOL N/A 10/07/2014   Procedure: COLONOSCOPY WITH PROPOFOL;  Surgeon: Charolett Bumpers, MD;  Location: [facility];  Service: Endoscopy;  Laterality: N/A;   ESOPHAGOGASTRODUODENOSCOPY N/A 07/23/2022   Procedure: ESOPHAGOGASTRODUODENOSCOPY (EGD);  Surgeon: Charlott Rakes, MD;  Location: [facility];  Service: Gastroenterology;  Laterality: N/A;   FOOT FRACTURE SURGERY Right    ORIF   LEG SURGERY Left    Left leg surgery for" fracture near hip bone"   PROSTATE BIOPSY      Family History  Problem Relation Age of Onset   Prostate cancer Father    Breast cancer Sister    Colon cancer Neg Hx    Pancreatic cancer Neg Hx    Social History:  reports that he has never smoked. He has never used smokeless tobacco. He reports current alcohol use. He reports that he does not use drugs.  Allergies:  Allergies  Allergen Reactions   Hydrocodone Itching   Hydrocodone-Acetaminophen Itching   Metformin Diarrhea    Medications Prior to Admission  Medication Sig Dispense Refill   amLODipine (NORVASC) 10 MG tablet Take 10 mg by mouth daily.     carvedilol (COREG) 12.5 MG tablet Take 12.5 mg by mouth daily.   4   dorzolamide-timolol (COSOPT) 2-0.5 % ophthalmic solution Place 1 drop into  both eyes 2 (two) times daily.     gabapentin (NEURONTIN) 300 MG capsule Take 900 mg by mouth as needed (nerve pain).     hydrALAZINE (APRESOLINE) 50 MG tablet Take 50 mg by mouth daily.     pravastatin (PRAVACHOL) 40 MG tablet Take 40 mg by mouth daily.     ROCKLATAN 0.02-0.005 % SOLN Place 1 drop into both eyes at bedtime.     hydrocortisone 2.5 % cream Apply 1 Application topically 2 (two) times daily as needed (hemorrhoids). (Patient not taking: Reported on 04/08/2023)     multivitamin (ONE-A-DAY MEN'S) TABS tablet Take 1 tablet by mouth daily. (Patient not taking: Reported on 04/08/2023) 30 tablet 2   tamsulosin (FLOMAX) 0.4 MG CAPS capsule Take 0.4 mg by mouth daily.      Results for orders placed or performed during the hospital encounter of 04/11/23 (from the past 48 hour(s))  Glucose, capillary     Status: Abnormal   Collection Time: 04/11/23  7:14 AM  Result Value Ref Range   Glucose-Capillary 139 (H) 70 - 99 mg/dL    Comment: Glucose reference range applies only to samples taken after fasting for at least 8 hours.   Comment 1 Notify RN   Comprehensive metabolic panel per protocol     Status: Abnormal   Collection Time: 04/11/23  7:43 AM  Result Value Ref Range   Sodium 137 135 - 145 mmol/L  Potassium 3.0 (L) 3.5 - 5.1 mmol/L   Chloride 107 98 - 111 mmol/L   CO2 18 (L) 22 - 32 mmol/L   Glucose, Bld 125 (H) 70 - 99 mg/dL    Comment: Glucose reference range applies only to samples taken after fasting for at least 8 hours.   BUN 11 8 - 23 mg/dL   Creatinine, Ser 5.17 0.61 - 1.24 mg/dL   Calcium 8.1 (L) 8.9 - 10.3 mg/dL   Total Protein 7.3 6.5 - 8.1 g/dL   Albumin 3.2 (L) 3.5 - 5.0 g/dL   AST 30 15 - 41 U/L   ALT 16 0 - 44 U/L   Alkaline Phosphatase 73 38 - 126 U/L   Total Bilirubin 0.5 0.3 - 1.2 mg/dL   GFR, Estimated >61 >60 mL/min    Comment: (NOTE) Calculated using the CKD-EPI Creatinine Equation (2021)    Anion gap 12 5 - 15    Comment: Performed at [facility], [address] [place], [place] [address]  CBC per protocol     Status: Abnormal   Collection Time: 04/11/23  7:43 AM  Result Value Ref Range   WBC 5.4 4.0 - 10.5 K/uL   RBC 4.07 (L) 4.22 - 5.81 MIL/uL   Hemoglobin 12.4 (L) 13.0 - 17.0 g/dL   HCT 62.6 (L) 94.8 - 54.6 %   MCV 93.4 80.0 - 100.0 fL   MCH 30.5 26.0 - 34.0 pg   MCHC 32.6 30.0 - 36.0 g/dL   RDW 27.0 35.0 - 09.3 %   Platelets 288 150 - 400 K/uL   nRBC 0.0 0.0 - 0.2 %    Comment: Performed at [facility], [address], [place], [place] [address]   No results found.  Review of Systems  Blood pressure 136/81, temperature (!) 97.2 F (36.2 C), resp. rate 18, height 5\' 10"  (1.778 m), weight 74.4 kg, SpO2 99%. Physical Exam HENT:     Head: Normocephalic.     Mouth/Throat:     Mouth: Mucous membranes are moist.  Cardiovascular:     Rate and Rhythm: Normal rate and regular rhythm.  Pulmonary:     Effort: Pulmonary effort is normal.     Breath sounds: Normal breath sounds.  Abdominal:     General: Abdomen is flat.     Palpations: Abdomen is soft.     Tenderness: There is no abdominal tenderness.  Genitourinary:    Comments: Refer to office notes Skin:    General: Skin is warm.  Neurological:     Mental Status: He is alert and oriented to person, place, and time.  Psychiatric:        Mood and Affect: Mood normal.      Assessment/Plan External hemorrhoid and internal hemorrhoids with bleeding -for external hemorrhoidectomy and possible internal hemorrhoidectomy.  Procedure, risks, and benefits were discussed in detail with him.  I also discussed the expected postoperative course.  He is agreeable.  Liz Malady, MD 04/11/2023, 8:36 AM

## 2023-04-11 NOTE — Transfer of Care (Signed)
Immediate Anesthesia Transfer of Care Note  Patient: Victor Garcia  Procedure(s) Performed: EXTERNAL AND INTERNAL HEMORRHOIDECTOMY (Buttocks)  Patient Location: PACU  Anesthesia Type:General  Level of Consciousness: awake and alert   Airway & Oxygen Therapy: Patient Spontanous Breathing  Post-op Assessment: Report given to RN  Post vital signs: Reviewed and stable  Last Vitals:  Vitals Value Taken Time  BP 116/72 04/11/23 0955  Temp 36.5 C 04/11/23 0955  Pulse 71 04/11/23 0958  Resp 20 04/11/23 0958  SpO2 96 % 04/11/23 0958  Vitals shown include unfiled device data.  Last Pain:  Vitals:   04/11/23 0955  PainSc: Asleep         Complications: No notable events documented.

## 2023-04-12 ENCOUNTER — Encounter (HOSPITAL_COMMUNITY): Payer: Self-pay | Admitting: General Surgery

## 2023-04-12 LAB — SURGICAL PATHOLOGY

## 2023-04-12 MED FILL — Thrombin For Soln 20000 Unit: CUTANEOUS | Qty: 1 | Status: AC

## 2023-04-23 ENCOUNTER — Encounter: Payer: Self-pay | Admitting: *Deleted

## 2023-04-23 ENCOUNTER — Other Ambulatory Visit: Payer: Self-pay

## 2023-04-23 ENCOUNTER — Ambulatory Visit
Admission: EM | Admit: 2023-04-23 | Discharge: 2023-04-23 | Disposition: A | Discharge status: Home / Self Care | Payer: Medicare PPO | Attending: Physician Assistant | Admitting: Physician Assistant

## 2023-04-23 ENCOUNTER — Encounter (HOSPITAL_COMMUNITY): Payer: Self-pay | Admitting: Emergency Medicine

## 2023-04-23 ENCOUNTER — Emergency Department (HOSPITAL_COMMUNITY)
Admission: EM | Admit: 2023-04-23 | Discharge: 2023-04-24 | Discharge status: Against Medical Advice | Payer: Medicare PPO | Attending: Emergency Medicine | Admitting: Emergency Medicine

## 2023-04-23 DIAGNOSIS — S61011A Laceration without foreign body of right thumb without damage to nail, initial encounter: Secondary | ICD-10-CM | POA: Diagnosis not present

## 2023-04-23 DIAGNOSIS — Z5321 Procedure and treatment not carried out due to patient leaving prior to being seen by health care provider: Secondary | ICD-10-CM | POA: Diagnosis not present

## 2023-04-23 DIAGNOSIS — X58XXXA Exposure to other specified factors, initial encounter: Secondary | ICD-10-CM | POA: Diagnosis not present

## 2023-04-23 MED ORDER — DOXYCYCLINE HYCLATE 100 MG PO CAPS
100.0000 mg | ORAL_CAPSULE | Freq: Two times a day (BID) | ORAL | 0 refills | Status: AC
Start: 2023-04-23 — End: ?

## 2023-04-23 NOTE — ED Provider Notes (Signed)
[facility]    CSN: [id number] Arrival date & time: 04/23/23  1504      History   Chief Complaint Chief Complaint  Patient presents with  . Laceration    HPI Victor Garcia is a 72 y.o. male.    Laceration Associated symptoms: no fever     Past Medical History:  Diagnosis Date  . Anemia   . Diabetes mellitus without complication (HCC)   . GERD (gastroesophageal reflux disease)   . Hypertension   . Prostate cancer (HCC)   . Sleep apnea    cpap    Patient Active Problem List   Diagnosis Date Noted  . GI bleed 07/23/2022  . Hypocalcemia 07/19/2022  . Hypokalemia 07/19/2022  . Hypomagnesemia 07/19/2022  . AKI (acute kidney injury) (HCC) 07/19/2022  . Leukopenia 07/19/2022  . Anemia 07/19/2022  . Diarrhea 07/19/2022  . Influenza A 07/19/2022  . Adenomatous polyp of colon 08/26/2020  . DDD (degenerative disc disease), cervical 08/26/2020  . Dysesthesia 08/26/2020  . Erectile dysfunction due to arterial insufficiency 08/26/2020  . Essential hypertension 08/26/2020  . Gastro-esophageal reflux disease without esophagitis 08/26/2020  . Mixed hyperlipidemia 08/26/2020  . Obesity 08/26/2020  . Obstructive sleep apnea syndrome 08/26/2020  . Type 2 diabetes mellitus with other specified complication (HCC) 08/26/2020  . Malignant neoplasm of prostate (HCC) 08/26/2020    Past Surgical History:  Procedure Laterality Date  . COLONOSCOPY N/A 07/23/2022   Procedure: COLONOSCOPY;  Surgeon: Charlott Rakes, MD;  Location: [facility];  Service: Gastroenterology;  Laterality: N/A;  . COLONOSCOPY WITH PROPOFOL N/A 10/07/2014   Procedure: COLONOSCOPY WITH PROPOFOL;  Surgeon: Charolett Bumpers, MD;  Location: [facility];  Service: Endoscopy;  Laterality: N/A;  . ESOPHAGOGASTRODUODENOSCOPY N/A 07/23/2022   Procedure: ESOPHAGOGASTRODUODENOSCOPY (EGD);  Surgeon: Charlott Rakes, MD;  Location: [facility];  Service: Gastroenterology;  Laterality: N/A;  . FOOT  FRACTURE SURGERY Right    ORIF  . HEMORRHOID SURGERY N/A 04/11/2023   Procedure: EXTERNAL AND INTERNAL HEMORRHOIDECTOMY;  Surgeon: Violeta Gelinas, MD;  Location: [facility] OR;  Service: General;  Laterality: N/A;  . LEG SURGERY Left    Left leg surgery for" fracture near hip bone"  . PROSTATE BIOPSY         Home Medications    Prior to Admission medications   Medication Sig Start Date End Date Taking? Authorizing Provider  amLODipine (NORVASC) 10 MG tablet Take 10 mg by mouth daily. 09/22/20   [provider]  carvedilol (COREG) 12.5 MG tablet Take 12.5 mg by mouth daily.  08/26/14   [provider]  dorzolamide-timolol (COSOPT) 2-0.5 % ophthalmic solution Place 1 drop into both eyes 2 (two) times daily. 06/28/22   [provider]  gabapentin (NEURONTIN) 300 MG capsule Take 900 mg by mouth as needed (nerve pain).    [provider]  hydrALAZINE (APRESOLINE) 50 MG tablet Take 50 mg by mouth daily.    [provider]  hydrocortisone 2.5 % cream Apply 1 Application topically 2 (two) times daily as needed (hemorrhoids). Patient not taking: Reported on 04/08/2023 12/22/19   [provider]  multivitamin (ONE-A-DAY MEN'S) TABS tablet Take 1 tablet by mouth daily. Patient not taking: Reported on 04/08/2023 07/23/22   Standley Brooking, MD  oxyCODONE (OXY IR/ROXICODONE) 5 MG immediate release tablet Take 1 tablet (5 mg total) by mouth every 6 (six) hours as needed for up to 20 doses for severe pain. 04/11/23   Violeta Gelinas, MD  pravastatin (PRAVACHOL) 40 MG  tablet Take 40 mg by mouth daily.    [provider]  ROCKLATAN 0.02-0.005 % SOLN Place 1 drop into both eyes at bedtime. 07/06/22   [provider]  tamsulosin (FLOMAX) 0.4 MG CAPS capsule Take 0.4 mg by mouth daily. 06/25/22   [provider]    Family History Family History  Problem Relation Age of Onset  . Prostate cancer Father   . Breast cancer Sister   . Colon  cancer Neg Hx   . Pancreatic cancer Neg Hx     Social History Social History   Tobacco Use  . Smoking status: Never  . Smokeless tobacco: Never  Vaping Use  . Vaping status: Never Used  Substance Use Topics  . Alcohol use: Yes    Comment: daily use  . Drug use: No     Allergies   Hydrocodone, Hydrocodone-acetaminophen, Metformin, and Oxycodone   Review of Systems Review of Systems  Constitutional:  Negative for chills and fever.  Eyes:  Negative for discharge and redness.  Skin:  Positive for color change and wound.  Neurological:  Negative for numbness.     Physical Exam Triage Vital Signs ED Triage Vitals  Encounter Vitals Group     BP 04/23/23 1509 (!) 149/77     Systolic BP Percentile --      Diastolic BP Percentile --      Pulse Rate 04/23/23 1509 80     Resp --      Temp 04/23/23 1509 99.1 F (37.3 C)     Temp Source 04/23/23 1509 Oral     SpO2 04/23/23 1509 97 %     Weight --      Height --      Head Circumference --      Peak Flow --      Pain Score 04/23/23 1510 5     Pain Loc --      Pain Education --      Exclude from Growth Chart --    No data found.  Updated Vital Signs BP (!) 149/77 (BP Location: Left Arm)   Pulse 80   Temp 99.1 F (37.3 C) (Oral)   SpO2 97%   Visual Acuity Right Eye Distance:   Left Eye Distance:   Bilateral Distance:    Right Eye Near:   Left Eye Near:    Bilateral Near:     Physical Exam Vitals and nursing note reviewed.  Constitutional:      General: He is not in acute distress.    Appearance: Normal appearance. He is not ill-appearing.  HENT:     Head: Normocephalic and atraumatic.  Eyes:     Conjunctiva/sclera: Conjunctivae normal.  Cardiovascular:     Rate and Rhythm: Normal rate.  Pulmonary:     Effort: Pulmonary effort is normal. No respiratory distress.  Neurological:     Mental Status: He is alert.  Psychiatric:        Mood and Affect: Mood normal.        Behavior: Behavior normal.         Thought Content: Thought content normal.     UC Treatments / Results  Labs (all labs ordered are listed, but only abnormal results are displayed) Labs Reviewed - No data to display  EKG   Radiology No results found.  Procedures Procedures (including critical Garcia time)  Medications Ordered in UC Medications - No data to display  Initial Impression / Assessment and Plan / UC Course  I have reviewed the triage vital signs and the nursing notes.  Pertinent labs & imaging results that were available during my Garcia of the patient were reviewed by me and considered in my medical decision making (see chart for details).     *** Final Clinical Impressions(s) / UC Diagnoses   Final diagnoses:  None   Discharge Instructions   None    ED Prescriptions   None    PDMP not reviewed this encounter.

## 2023-04-23 NOTE — ED Triage Notes (Signed)
Pt c/o laceration to right thumb on a can, was seen at [facility] and had dermabound placed. States that the wound is open again, wound wrapped in gauze.

## 2023-04-23 NOTE — ED Notes (Signed)
Immunizations Vaccine Route Administration Date Status Comments  COVID-19, Pfizer bivalent 12+ Unknown 04/29/2021 Administered    COVID-19, Pfizer-BioNTech Unknown 08/27/2019 Administered    COVID-19, Pfizer-BioNTech Unknown 09/21/2019 Administered    COVID-19, Pfizer-BioNTech Unknown 05/28/2020 Administered    COVID-19, Pfizer-BioNTech Unknown 11/05/2020 Administered    FLU VACCINE - GIVEN OUTSIDE EAGLE Unknown 05/19/2010 Administered    FLU VACCINE - GIVEN OUTSIDE EAGLE Unknown 06/26/2013 Administered    FLU VACCINE - GIVEN OUTSIDE EAGLE Unknown 04/29/2021 Administered    FLULAVAL QUAD (State) 1 dose syringe IM Intramuscular 05/12/2012 Administered    FLUVIRIN IM Intramuscular 05/24/2011 Administered    FLUZONE HIGH DOSE (65 AND OLDER) IM Intramuscular 04/08/2015 Administered NDC# 16109-604-54  FLUZONE HIGH DOSE (65 AND OLDER) IM Intramuscular 04/06/2016 Administered ndc:49281-401-88  FLUZONE HIGH DOSE (65 AND OLDER) IM Intramuscular 04/19/2017 Administered    FLUZONE HIGH DOSE (65 AND OLDER) IM Intramuscular 05/01/2018 Pending NDC#49281-405-88  FLUZONE HIGH DOSE (65 AND OLDER) IM Intramuscular 05/02/2019 Administered    FLUZONE HIGH DOSE (65 AND OLDER) IM Intramuscular 06/25/2020 Administered    FLUZONE HIGH DOSE (65 AND OLDER) IM Intramuscular 04/16/2022 Administered    FLUZONE QUAD over 66yr multidose vial .5 ml IM Intramuscular 04/09/2014 Administered NDC# 09811-914-78  PNEUMOVAX (PPV23) Unknown 03/16/2005 Administered    PNEUMOVAX (PPV23) IM Intramuscular 08/06/2015 Administered NDC# 2956-2130-86  PREVNAR 13 (PCV13) * IM Intramuscular 07/31/2014 Administered NDC# 5784-6962-95  Shingrix Unknown 06/26/2020 Administered    Shingrix IM Intramuscular 09/05/2020 Administered    TDAP * IM Intramuscular 03/11/2010 Administered    TDAP * IM Intramuscular 09/28/2021 Administered    ZOSTAVAX (SHINGLES) IM Intramuscular 07/04/2012 Administered NDC# 2841-3244-01

## 2023-04-23 NOTE — ED Triage Notes (Signed)
Pt reports he cut right thumb on a tomato can

## 2023-05-27 DIAGNOSIS — K649 Unspecified hemorrhoids: Secondary | ICD-10-CM | POA: Diagnosis not present

## 2023-06-23 DIAGNOSIS — H524 Presbyopia: Secondary | ICD-10-CM | POA: Diagnosis not present

## 2023-06-23 DIAGNOSIS — H25013 Cortical age-related cataract, bilateral: Secondary | ICD-10-CM | POA: Diagnosis not present

## 2023-06-23 DIAGNOSIS — H401132 Primary open-angle glaucoma, bilateral, moderate stage: Secondary | ICD-10-CM | POA: Diagnosis not present

## 2023-06-27 DIAGNOSIS — D649 Anemia, unspecified: Secondary | ICD-10-CM | POA: Diagnosis not present

## 2023-06-27 DIAGNOSIS — E1140 Type 2 diabetes mellitus with diabetic neuropathy, unspecified: Secondary | ICD-10-CM | POA: Diagnosis not present

## 2023-06-27 DIAGNOSIS — C61 Malignant neoplasm of prostate: Secondary | ICD-10-CM | POA: Diagnosis not present

## 2023-06-27 DIAGNOSIS — K649 Unspecified hemorrhoids: Secondary | ICD-10-CM | POA: Diagnosis not present

## 2023-06-27 DIAGNOSIS — F1090 Alcohol use, unspecified, uncomplicated: Secondary | ICD-10-CM | POA: Diagnosis not present

## 2023-06-27 DIAGNOSIS — K219 Gastro-esophageal reflux disease without esophagitis: Secondary | ICD-10-CM | POA: Diagnosis not present

## 2023-06-27 DIAGNOSIS — E782 Mixed hyperlipidemia: Secondary | ICD-10-CM | POA: Diagnosis not present

## 2023-06-27 DIAGNOSIS — I10 Essential (primary) hypertension: Secondary | ICD-10-CM | POA: Diagnosis not present

## 2023-06-27 DIAGNOSIS — D519 Vitamin B12 deficiency anemia, unspecified: Secondary | ICD-10-CM | POA: Diagnosis not present

## 2023-07-27 DIAGNOSIS — K6289 Other specified diseases of anus and rectum: Secondary | ICD-10-CM | POA: Diagnosis not present

## 2023-08-17 DIAGNOSIS — K644 Residual hemorrhoidal skin tags: Secondary | ICD-10-CM | POA: Diagnosis not present

## 2023-08-17 DIAGNOSIS — K648 Other hemorrhoids: Secondary | ICD-10-CM | POA: Diagnosis not present

## 2023-09-05 DIAGNOSIS — C61 Malignant neoplasm of prostate: Secondary | ICD-10-CM | POA: Diagnosis not present

## 2023-09-05 DIAGNOSIS — E1169 Type 2 diabetes mellitus with other specified complication: Secondary | ICD-10-CM | POA: Diagnosis not present

## 2023-09-05 DIAGNOSIS — I10 Essential (primary) hypertension: Secondary | ICD-10-CM | POA: Diagnosis not present

## 2023-09-05 DIAGNOSIS — Z8546 Personal history of malignant neoplasm of prostate: Secondary | ICD-10-CM | POA: Diagnosis not present

## 2023-09-05 DIAGNOSIS — M5442 Lumbago with sciatica, left side: Secondary | ICD-10-CM | POA: Diagnosis not present

## 2023-09-12 DIAGNOSIS — N5201 Erectile dysfunction due to arterial insufficiency: Secondary | ICD-10-CM | POA: Diagnosis not present

## 2023-09-12 DIAGNOSIS — Z8546 Personal history of malignant neoplasm of prostate: Secondary | ICD-10-CM | POA: Diagnosis not present

## 2023-09-12 DIAGNOSIS — R3912 Poor urinary stream: Secondary | ICD-10-CM | POA: Diagnosis not present

## 2023-09-30 DIAGNOSIS — H2513 Age-related nuclear cataract, bilateral: Secondary | ICD-10-CM | POA: Diagnosis not present

## 2023-09-30 DIAGNOSIS — H524 Presbyopia: Secondary | ICD-10-CM | POA: Diagnosis not present

## 2023-10-05 DIAGNOSIS — S32030A Wedge compression fracture of third lumbar vertebra, initial encounter for closed fracture: Secondary | ICD-10-CM | POA: Diagnosis not present

## 2023-10-19 DIAGNOSIS — H25811 Combined forms of age-related cataract, right eye: Secondary | ICD-10-CM | POA: Diagnosis not present

## 2023-10-19 DIAGNOSIS — H2511 Age-related nuclear cataract, right eye: Secondary | ICD-10-CM | POA: Diagnosis not present

## 2023-11-17 DIAGNOSIS — E559 Vitamin D deficiency, unspecified: Secondary | ICD-10-CM | POA: Diagnosis not present

## 2023-11-17 DIAGNOSIS — Z23 Encounter for immunization: Secondary | ICD-10-CM | POA: Diagnosis not present

## 2023-11-17 DIAGNOSIS — I10 Essential (primary) hypertension: Secondary | ICD-10-CM | POA: Diagnosis not present

## 2023-11-17 DIAGNOSIS — D649 Anemia, unspecified: Secondary | ICD-10-CM | POA: Diagnosis not present

## 2023-11-17 DIAGNOSIS — C61 Malignant neoplasm of prostate: Secondary | ICD-10-CM | POA: Diagnosis not present

## 2023-11-17 DIAGNOSIS — F1090 Alcohol use, unspecified, uncomplicated: Secondary | ICD-10-CM | POA: Diagnosis not present

## 2023-11-17 DIAGNOSIS — S32000A Wedge compression fracture of unspecified lumbar vertebra, initial encounter for closed fracture: Secondary | ICD-10-CM | POA: Diagnosis not present

## 2023-11-17 DIAGNOSIS — D519 Vitamin B12 deficiency anemia, unspecified: Secondary | ICD-10-CM | POA: Diagnosis not present

## 2023-11-17 DIAGNOSIS — E1169 Type 2 diabetes mellitus with other specified complication: Secondary | ICD-10-CM | POA: Diagnosis not present

## 2023-11-17 DIAGNOSIS — Z0000 Encounter for general adult medical examination without abnormal findings: Secondary | ICD-10-CM | POA: Diagnosis not present

## 2023-12-08 DIAGNOSIS — E876 Hypokalemia: Secondary | ICD-10-CM | POA: Diagnosis not present

## 2023-12-08 DIAGNOSIS — D519 Vitamin B12 deficiency anemia, unspecified: Secondary | ICD-10-CM | POA: Diagnosis not present

## 2024-02-28 DIAGNOSIS — C61 Malignant neoplasm of prostate: Secondary | ICD-10-CM | POA: Diagnosis not present

## 2024-02-28 DIAGNOSIS — Z8546 Personal history of malignant neoplasm of prostate: Secondary | ICD-10-CM | POA: Diagnosis not present

## 2024-03-06 DIAGNOSIS — N5201 Erectile dysfunction due to arterial insufficiency: Secondary | ICD-10-CM | POA: Diagnosis not present

## 2024-03-06 DIAGNOSIS — R3912 Poor urinary stream: Secondary | ICD-10-CM | POA: Diagnosis not present

## 2024-03-06 DIAGNOSIS — Z8546 Personal history of malignant neoplasm of prostate: Secondary | ICD-10-CM | POA: Diagnosis not present

## 2024-03-13 DIAGNOSIS — H2512 Age-related nuclear cataract, left eye: Secondary | ICD-10-CM | POA: Diagnosis not present

## 2024-03-13 DIAGNOSIS — H401131 Primary open-angle glaucoma, bilateral, mild stage: Secondary | ICD-10-CM | POA: Diagnosis not present

## 2024-03-26 DIAGNOSIS — H25012 Cortical age-related cataract, left eye: Secondary | ICD-10-CM | POA: Diagnosis not present

## 2024-04-08 DIAGNOSIS — R059 Cough, unspecified: Secondary | ICD-10-CM | POA: Diagnosis not present

## 2024-04-11 DIAGNOSIS — H25812 Combined forms of age-related cataract, left eye: Secondary | ICD-10-CM | POA: Diagnosis not present

## 2024-04-11 DIAGNOSIS — H25012 Cortical age-related cataract, left eye: Secondary | ICD-10-CM | POA: Diagnosis not present

## 2024-04-11 DIAGNOSIS — H2512 Age-related nuclear cataract, left eye: Secondary | ICD-10-CM | POA: Diagnosis not present

## 2024-05-15 DIAGNOSIS — D519 Vitamin B12 deficiency anemia, unspecified: Secondary | ICD-10-CM | POA: Diagnosis not present

## 2024-05-15 DIAGNOSIS — C61 Malignant neoplasm of prostate: Secondary | ICD-10-CM | POA: Diagnosis not present

## 2024-05-15 DIAGNOSIS — I10 Essential (primary) hypertension: Secondary | ICD-10-CM | POA: Diagnosis not present

## 2024-05-15 DIAGNOSIS — S32000A Wedge compression fracture of unspecified lumbar vertebra, initial encounter for closed fracture: Secondary | ICD-10-CM | POA: Diagnosis not present

## 2024-05-15 DIAGNOSIS — F1090 Alcohol use, unspecified, uncomplicated: Secondary | ICD-10-CM | POA: Diagnosis not present

## 2024-05-15 DIAGNOSIS — K760 Fatty (change of) liver, not elsewhere classified: Secondary | ICD-10-CM | POA: Diagnosis not present

## 2024-05-15 DIAGNOSIS — G629 Polyneuropathy, unspecified: Secondary | ICD-10-CM | POA: Diagnosis not present

## 2024-05-15 DIAGNOSIS — E1169 Type 2 diabetes mellitus with other specified complication: Secondary | ICD-10-CM | POA: Diagnosis not present

## 2024-05-15 DIAGNOSIS — E559 Vitamin D deficiency, unspecified: Secondary | ICD-10-CM | POA: Diagnosis not present
